# Patient Record
Sex: Male | Born: 1935 | Race: White | Hispanic: No | Marital: Married | Smoking: Former smoker
Health system: Southern US, Community
[De-identification: ages and names within clinical notes are randomized; demographics above are authoritative.]

## PROBLEM LIST (undated history)

## (undated) DIAGNOSIS — I10 Essential (primary) hypertension: Secondary | ICD-10-CM

## (undated) DIAGNOSIS — H409 Unspecified glaucoma: Secondary | ICD-10-CM

## (undated) DIAGNOSIS — R011 Cardiac murmur, unspecified: Secondary | ICD-10-CM

## (undated) DIAGNOSIS — M549 Dorsalgia, unspecified: Secondary | ICD-10-CM

## (undated) DIAGNOSIS — M109 Gout, unspecified: Secondary | ICD-10-CM

## (undated) DIAGNOSIS — R9431 Abnormal electrocardiogram [ECG] [EKG]: Secondary | ICD-10-CM

## (undated) DIAGNOSIS — H65499 Other chronic nonsuppurative otitis media, unspecified ear: Secondary | ICD-10-CM

## (undated) DIAGNOSIS — N401 Enlarged prostate with lower urinary tract symptoms: Secondary | ICD-10-CM

## (undated) DIAGNOSIS — E782 Mixed hyperlipidemia: Secondary | ICD-10-CM

## (undated) DIAGNOSIS — M159 Polyosteoarthritis, unspecified: Secondary | ICD-10-CM

## (undated) DIAGNOSIS — N138 Other obstructive and reflux uropathy: Secondary | ICD-10-CM

## (undated) DIAGNOSIS — I6529 Occlusion and stenosis of unspecified carotid artery: Secondary | ICD-10-CM

## (undated) DIAGNOSIS — R5383 Other fatigue: Secondary | ICD-10-CM

## (undated) DIAGNOSIS — R5381 Other malaise: Secondary | ICD-10-CM

## (undated) HISTORY — DX: Cardiac murmur, unspecified: R01.1

## (undated) HISTORY — DX: Abnormal electrocardiogram (ECG) (EKG): R94.31

## (undated) HISTORY — DX: Occlusion and stenosis of unspecified carotid artery: I65.29

## (undated) HISTORY — DX: Other malaise: R53.81

## (undated) HISTORY — PX: COLONOSCOPY: SHX174

## (undated) HISTORY — DX: Gout, unspecified: M10.9

## (undated) HISTORY — DX: Other chronic nonsuppurative otitis media, unspecified ear: H65.499

## (undated) HISTORY — DX: Other fatigue: R53.83

## (undated) HISTORY — DX: Polyosteoarthritis, unspecified: M15.9

## (undated) HISTORY — DX: Essential (primary) hypertension: I10

## (undated) HISTORY — DX: Dorsalgia, unspecified: M54.9

## (undated) HISTORY — DX: Other obstructive and reflux uropathy: N13.8

## (undated) HISTORY — DX: Unspecified glaucoma: H40.9

## (undated) HISTORY — DX: Benign prostatic hyperplasia with lower urinary tract symptoms: N40.1

## (undated) HISTORY — DX: Mixed hyperlipidemia: E78.2

---

## 1997-03-31 HISTORY — PX: TRANSURETHRAL RESECTION OF PROSTATE: SHX73

## 2003-03-29 ENCOUNTER — Ambulatory Visit: Admission: RE | Admit: 2003-03-29 | Discharge: 2003-03-29 | Payer: Self-pay | Admitting: Internal Medicine

## 2003-11-14 ENCOUNTER — Ambulatory Visit (HOSPITAL_COMMUNITY): Admission: RE | Admit: 2003-11-14 | Discharge: 2003-11-14 | Payer: Self-pay | Admitting: *Deleted

## 2003-11-14 ENCOUNTER — Encounter (INDEPENDENT_AMBULATORY_CARE_PROVIDER_SITE_OTHER): Payer: Self-pay | Admitting: *Deleted

## 2005-12-29 ENCOUNTER — Ambulatory Visit (HOSPITAL_COMMUNITY): Admission: RE | Admit: 2005-12-29 | Discharge: 2005-12-29 | Payer: Self-pay | Admitting: *Deleted

## 2007-10-20 ENCOUNTER — Encounter: Admission: RE | Admit: 2007-10-20 | Discharge: 2007-10-20 | Payer: Self-pay | Admitting: Internal Medicine

## 2008-07-14 ENCOUNTER — Encounter: Admission: RE | Admit: 2008-07-14 | Discharge: 2008-07-14 | Payer: Self-pay | Admitting: Internal Medicine

## 2010-04-22 ENCOUNTER — Encounter: Payer: Self-pay | Admitting: Internal Medicine

## 2010-08-16 NOTE — Op Note (Signed)
NAME:  CEVIN, RUBINSTEIN                   ACCOUNT NO.:  [id number]   MEDICAL RECORD NO.:  [id number]                   PATIENT TYPE:  AMB   LOCATION:  ENDO                                 FACILITY:  [facility]   PHYSICIAN:  Georgiana Spinner, M.D.                 DATE OF BIRTH:  1918-02-22   DATE OF PROCEDURE:  11/14/2003  DATE OF DISCHARGE:                                 OPERATIVE REPORT   PROCEDURE:  Colonoscopy with biopsy and polypectomy.   INDICATION:  Colon polyp.   ANESTHESIA:  1. Demerol 60.  2. Versed 6 mg.   PROCEDURE:  With the patient placed supine on the operating table, in the  left lateral decubitus position the Olympus videoscopic colonoscope was  inserted in the rectum, advanced from the rectum to the cecum identified by  the ileocecal valve and appendiceal orifice.  In the cecum was an AVM that  was fairly large and we photographed only.  There was also a polyp seen in  the cecum which was photographed and removed using snare cautery technique  __________ 20/20 blended current.  A second polyp was seen and it too was  then photographed and removed using snare cautery technique __________ 20/20  blended current.  Of note, hot biopsy forceps were needed for both of these  polyps at their base, because of a slight amount of bleeding, to cauterize  the base.  The endoscope was then slowly withdrawn, taking circumferential  views of remaining colonic mucosa, stopping near the hepatic flexure where a  third polyp was seen and was removed using hot biopsy forceps technique  only.  The endoscope was straightened and withdrawn.  The patient was  __________ .  The patient tolerated the procedure well with no apparent  complications.   FINDINGS:  __________ as described above, AVM of cecum, internal  hemorrhoids.   PLAN:  Await biopsy report.  Patient to call me for results of biopsy as an  outpatient.  See discharge dictation for further discharge notes and further  orders.                                               Georgiana Spinner, M.D.    GMO/MEDQ  D:  11/14/2003  T:  11/14/2003  Job:  [id number]

## 2010-08-16 NOTE — Op Note (Signed)
[name], Jason Austin              ACCOUNT NO.:  [id number]   MEDICAL RECORD NO.:  [id number]          PATIENT TYPE:  AMB   LOCATION:  ENDO                         FACILITY:  [facility]   PHYSICIAN:  Georgiana Spinner, M.D.    DATE OF BIRTH:  March 02, 1918   DATE OF PROCEDURE:  12/29/2005  DATE OF DISCHARGE:                                 OPERATIVE REPORT   PROCEDURE:  Colonoscopy.   INDICATIONS:  Colon cancer screening, colon polyps.   ANESTHESIA:  1. Fentanyl 75 mcg.  2. Versed 7.5 mg.   PROCEDURE:  With the patient mildly sedated in the left lateral decubitus  position, a rectal exam was performed which was unremarkable.  Subsequently,  the Olympus videoscopic colonoscope was inserted in the rectum, passed under  direct vision to the cecum, identified by the ileocecal valve and  appendiceal orifice, both of which were photographed.  From this point, the  colonoscope was slowly withdrawn, taking circumferential views of the  colonic mucosa, stopping in the rectum which appeared normal on direct and  showed hemorrhoids on retroflexed view. The endoscope was straightened and  withdrawn.  The patient's vital signs and pulse oximeter remained stable.  The patient tolerated the procedure well, without apparent complications.   FINDINGS:  Diverticulosis of sigmoid colon.  Internal hemorrhoids.  Otherwise, unremarkable examination.   PLAN:  Have patient follow up with me as needed.           ______________________________  Georgiana Spinner, M.D.     GMO/MEDQ  D:  12/29/2005  T:  12/29/2005  Job:  [id number]

## 2010-12-20 IMAGING — US US EXTREM LOW VENOUS*L*
1 series · 14 of 24 positions shown · non-contrast
Comparison: None

CLINICAL DATA: Left leg edema.



[Series 1: us extrem low venous*left* · 14 of 30 slices shown]
[im 1/30]
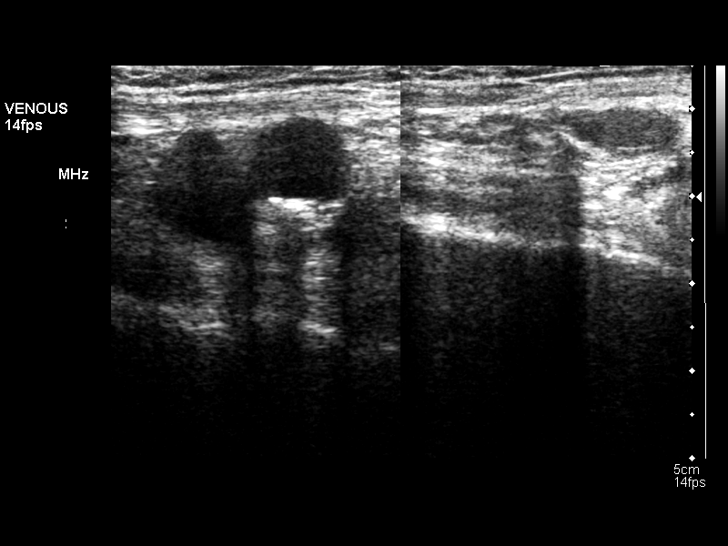
[im 3/30]
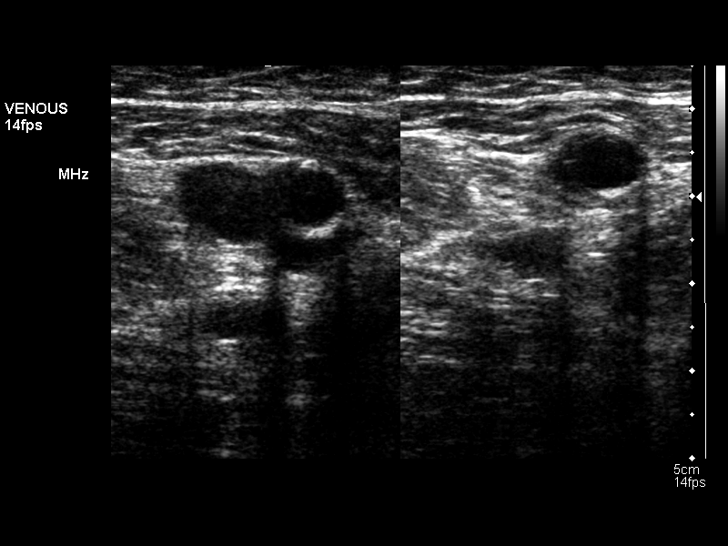
[im 6/30]
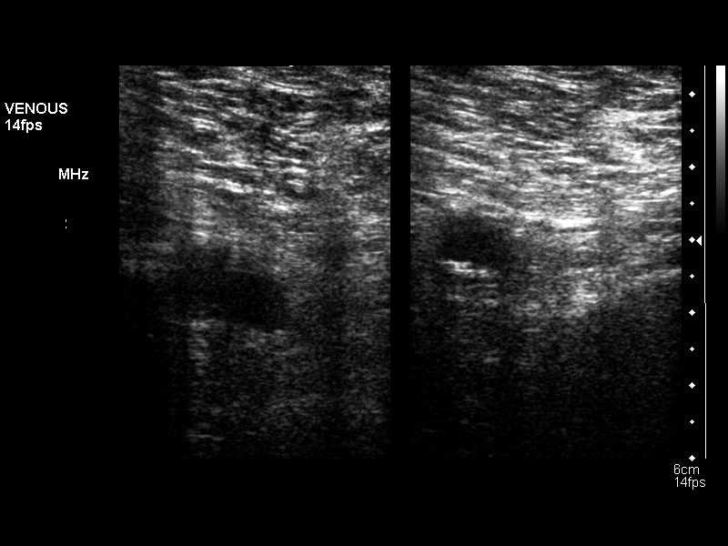
[im 8/30]
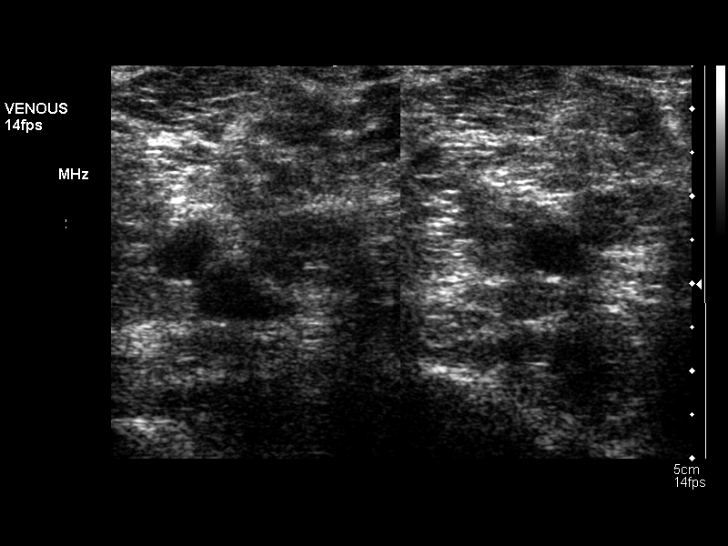
[im 9/30]
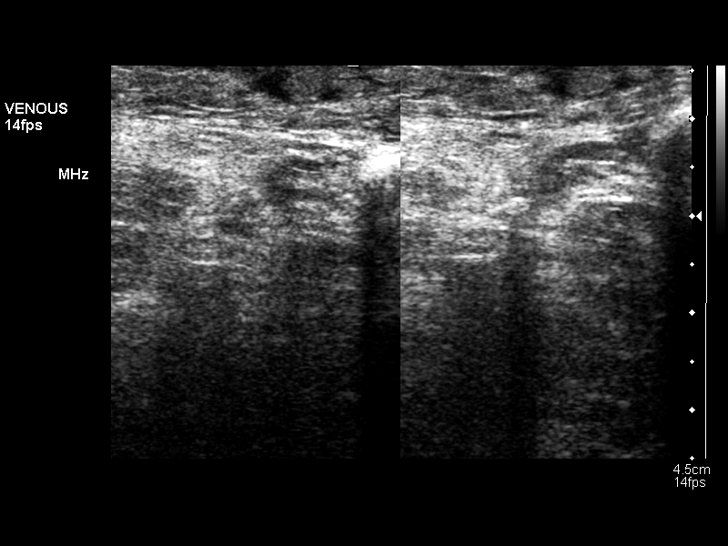
[im 12/30]
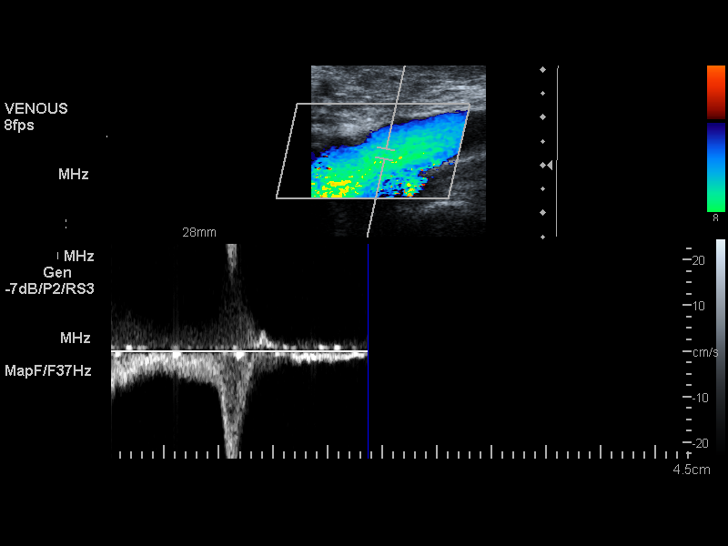
[im 14/30]
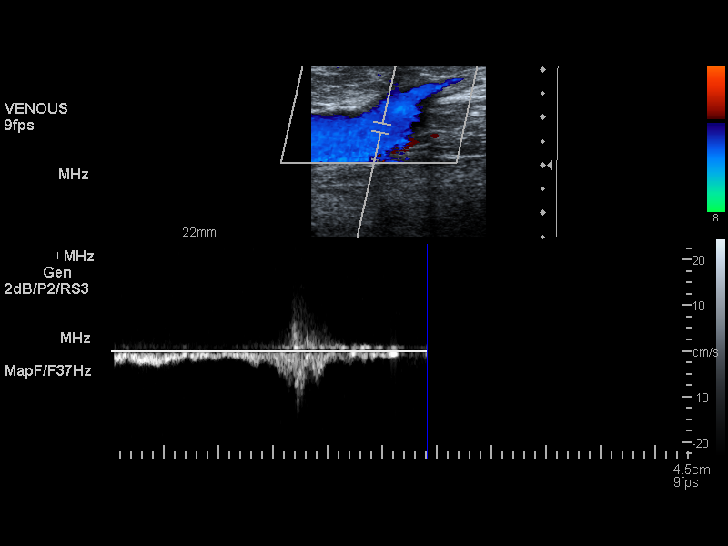
[im 16/30]
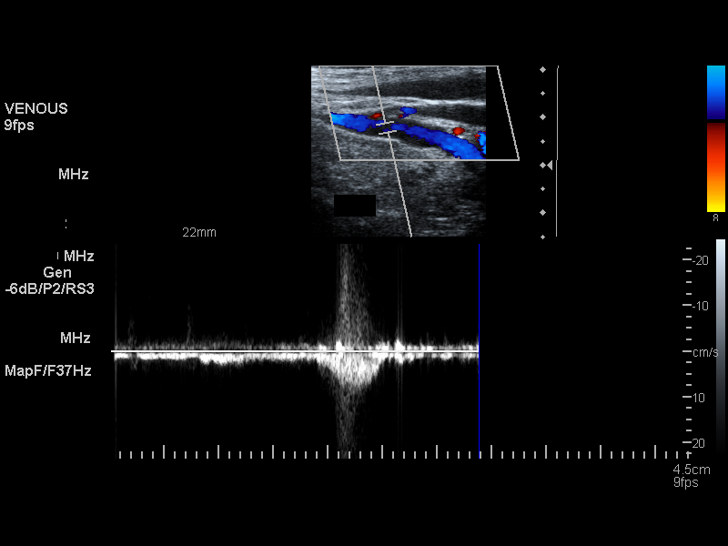
[im 18/30]
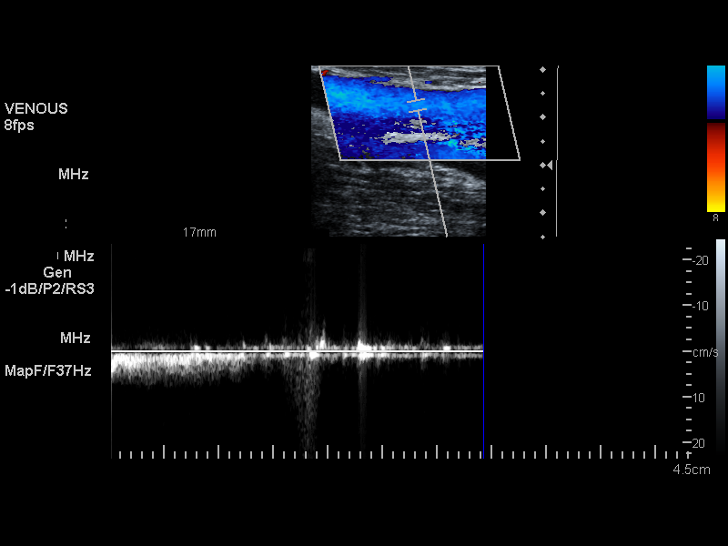
[im 21/30]
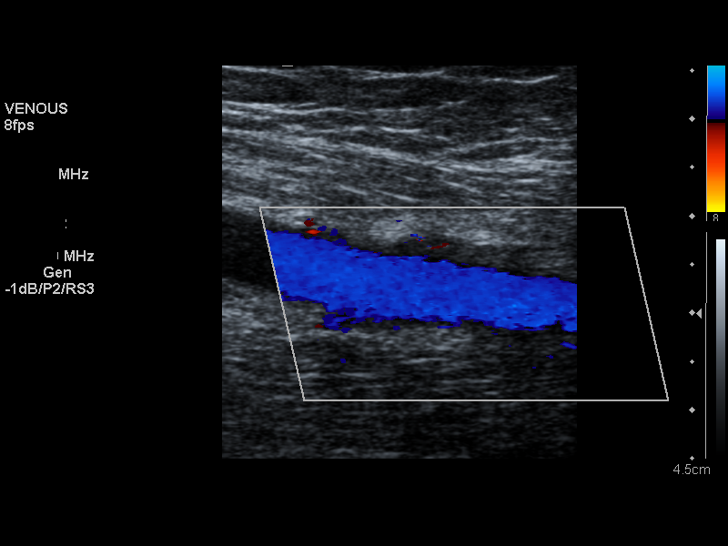
[im 23/30]
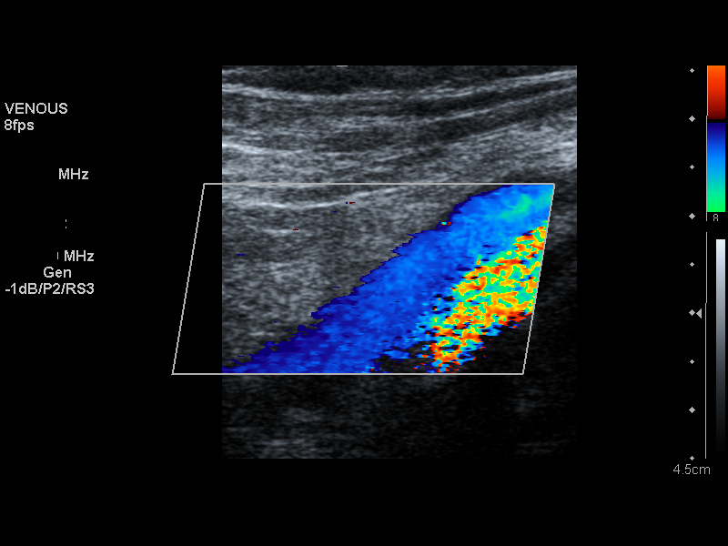
[im 24/30]
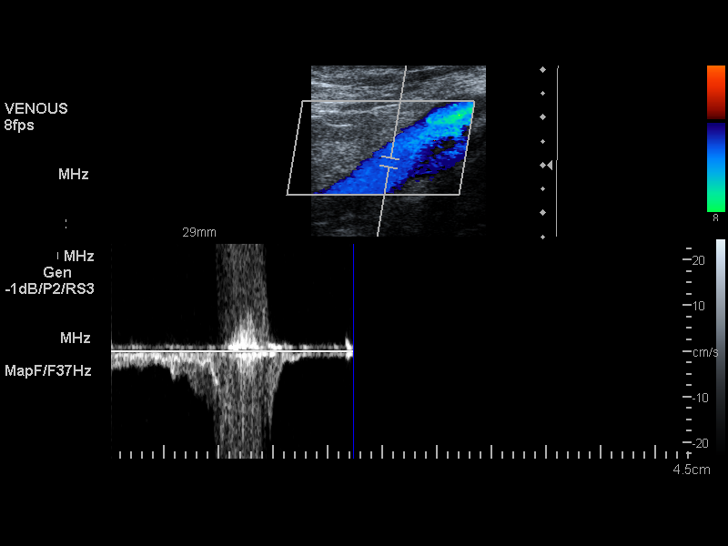
[im 27/30]
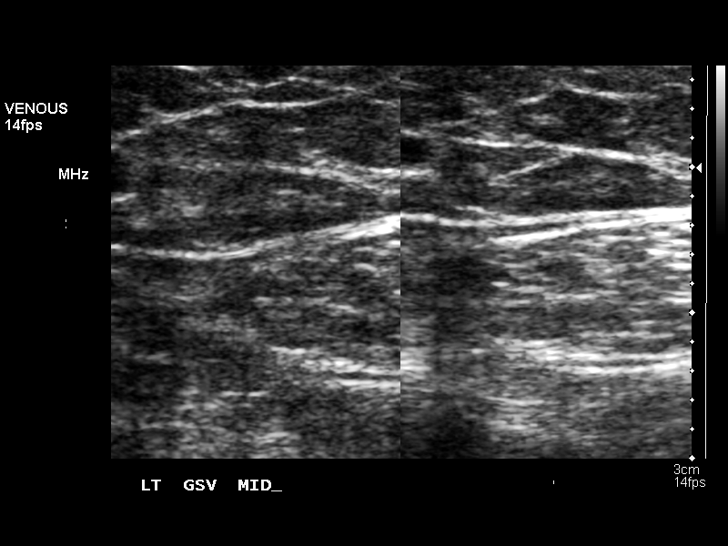
[im 30/30]
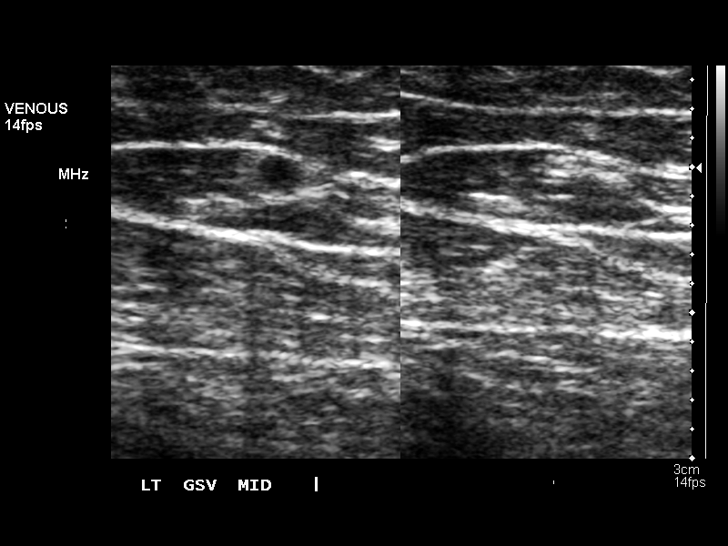

[14 of 24 positions shown; findings below may reference images not displayed]

FINDINGS: Normal compressibility of  the left common femoral,
superficial femoral, and popliteal veins is demonstrated, as well
as the visualized proximal calf veins.  No filling defects to
suggest DVT on grayscale or color Doppler imaging.  Doppler
waveforms show normal direction of venous flow, normal respiratory
phasicity and response to augmentation.
IMPRESSION: No evidence of  left lower extremity deep vein thrombosis.

## 2011-04-16 DIAGNOSIS — H409 Unspecified glaucoma: Secondary | ICD-10-CM | POA: Diagnosis not present

## 2011-04-16 DIAGNOSIS — H3530 Unspecified macular degeneration: Secondary | ICD-10-CM | POA: Diagnosis not present

## 2011-04-16 DIAGNOSIS — H4011X0 Primary open-angle glaucoma, stage unspecified: Secondary | ICD-10-CM | POA: Diagnosis not present

## 2011-06-30 DIAGNOSIS — H66009 Acute suppurative otitis media without spontaneous rupture of ear drum, unspecified ear: Secondary | ICD-10-CM | POA: Diagnosis not present

## 2011-06-30 DIAGNOSIS — H7290 Unspecified perforation of tympanic membrane, unspecified ear: Secondary | ICD-10-CM | POA: Diagnosis not present

## 2011-07-28 DIAGNOSIS — H7290 Unspecified perforation of tympanic membrane, unspecified ear: Secondary | ICD-10-CM | POA: Diagnosis not present

## 2011-07-28 DIAGNOSIS — H66009 Acute suppurative otitis media without spontaneous rupture of ear drum, unspecified ear: Secondary | ICD-10-CM | POA: Diagnosis not present

## 2011-08-14 DIAGNOSIS — H4011X0 Primary open-angle glaucoma, stage unspecified: Secondary | ICD-10-CM | POA: Diagnosis not present

## 2011-08-14 DIAGNOSIS — H409 Unspecified glaucoma: Secondary | ICD-10-CM | POA: Diagnosis not present

## 2011-08-14 DIAGNOSIS — H3530 Unspecified macular degeneration: Secondary | ICD-10-CM | POA: Diagnosis not present

## 2011-09-02 DIAGNOSIS — I6529 Occlusion and stenosis of unspecified carotid artery: Secondary | ICD-10-CM | POA: Diagnosis not present

## 2011-09-02 DIAGNOSIS — E782 Mixed hyperlipidemia: Secondary | ICD-10-CM | POA: Diagnosis not present

## 2011-09-02 DIAGNOSIS — R9431 Abnormal electrocardiogram [ECG] [EKG]: Secondary | ICD-10-CM | POA: Diagnosis not present

## 2011-09-02 DIAGNOSIS — M109 Gout, unspecified: Secondary | ICD-10-CM | POA: Diagnosis not present

## 2011-09-02 DIAGNOSIS — I10 Essential (primary) hypertension: Secondary | ICD-10-CM | POA: Diagnosis not present

## 2011-09-02 DIAGNOSIS — R5381 Other malaise: Secondary | ICD-10-CM | POA: Diagnosis not present

## 2011-09-10 DIAGNOSIS — E782 Mixed hyperlipidemia: Secondary | ICD-10-CM | POA: Diagnosis not present

## 2011-09-10 DIAGNOSIS — M109 Gout, unspecified: Secondary | ICD-10-CM | POA: Diagnosis not present

## 2011-09-10 DIAGNOSIS — I10 Essential (primary) hypertension: Secondary | ICD-10-CM | POA: Diagnosis not present

## 2011-09-10 DIAGNOSIS — I6529 Occlusion and stenosis of unspecified carotid artery: Secondary | ICD-10-CM | POA: Diagnosis not present

## 2011-09-22 DIAGNOSIS — H66009 Acute suppurative otitis media without spontaneous rupture of ear drum, unspecified ear: Secondary | ICD-10-CM | POA: Diagnosis not present

## 2011-10-08 DIAGNOSIS — H7290 Unspecified perforation of tympanic membrane, unspecified ear: Secondary | ICD-10-CM | POA: Diagnosis not present

## 2011-10-08 DIAGNOSIS — H9190 Unspecified hearing loss, unspecified ear: Secondary | ICD-10-CM | POA: Diagnosis not present

## 2011-10-08 DIAGNOSIS — H66009 Acute suppurative otitis media without spontaneous rupture of ear drum, unspecified ear: Secondary | ICD-10-CM | POA: Diagnosis not present

## 2011-10-21 DIAGNOSIS — N401 Enlarged prostate with lower urinary tract symptoms: Secondary | ICD-10-CM | POA: Diagnosis not present

## 2011-10-21 DIAGNOSIS — M159 Polyosteoarthritis, unspecified: Secondary | ICD-10-CM | POA: Diagnosis not present

## 2011-10-21 DIAGNOSIS — E782 Mixed hyperlipidemia: Secondary | ICD-10-CM | POA: Diagnosis not present

## 2011-10-21 DIAGNOSIS — I10 Essential (primary) hypertension: Secondary | ICD-10-CM | POA: Diagnosis not present

## 2011-12-11 DIAGNOSIS — Z961 Presence of intraocular lens: Secondary | ICD-10-CM | POA: Diagnosis not present

## 2011-12-11 DIAGNOSIS — H4011X0 Primary open-angle glaucoma, stage unspecified: Secondary | ICD-10-CM | POA: Diagnosis not present

## 2011-12-11 DIAGNOSIS — H3530 Unspecified macular degeneration: Secondary | ICD-10-CM | POA: Diagnosis not present

## 2012-02-17 DIAGNOSIS — M109 Gout, unspecified: Secondary | ICD-10-CM | POA: Diagnosis not present

## 2012-02-17 DIAGNOSIS — Z0000 Encounter for general adult medical examination without abnormal findings: Secondary | ICD-10-CM | POA: Diagnosis not present

## 2012-02-17 DIAGNOSIS — Z1331 Encounter for screening for depression: Secondary | ICD-10-CM | POA: Diagnosis not present

## 2012-02-17 DIAGNOSIS — E782 Mixed hyperlipidemia: Secondary | ICD-10-CM | POA: Diagnosis not present

## 2012-02-17 DIAGNOSIS — R9431 Abnormal electrocardiogram [ECG] [EKG]: Secondary | ICD-10-CM | POA: Diagnosis not present

## 2012-02-17 DIAGNOSIS — I10 Essential (primary) hypertension: Secondary | ICD-10-CM | POA: Diagnosis not present

## 2012-03-03 DIAGNOSIS — M109 Gout, unspecified: Secondary | ICD-10-CM | POA: Diagnosis not present

## 2012-03-03 DIAGNOSIS — I10 Essential (primary) hypertension: Secondary | ICD-10-CM | POA: Diagnosis not present

## 2012-03-03 DIAGNOSIS — R9431 Abnormal electrocardiogram [ECG] [EKG]: Secondary | ICD-10-CM | POA: Diagnosis not present

## 2012-03-03 DIAGNOSIS — E782 Mixed hyperlipidemia: Secondary | ICD-10-CM | POA: Diagnosis not present

## 2012-03-18 DIAGNOSIS — I6529 Occlusion and stenosis of unspecified carotid artery: Secondary | ICD-10-CM | POA: Diagnosis not present

## 2012-06-10 DIAGNOSIS — H4011X0 Primary open-angle glaucoma, stage unspecified: Secondary | ICD-10-CM | POA: Diagnosis not present

## 2012-06-10 DIAGNOSIS — H3530 Unspecified macular degeneration: Secondary | ICD-10-CM | POA: Diagnosis not present

## 2012-06-10 DIAGNOSIS — Z961 Presence of intraocular lens: Secondary | ICD-10-CM | POA: Diagnosis not present

## 2012-06-10 DIAGNOSIS — I6529 Occlusion and stenosis of unspecified carotid artery: Secondary | ICD-10-CM | POA: Diagnosis not present

## 2012-07-01 DIAGNOSIS — R42 Dizziness and giddiness: Secondary | ICD-10-CM | POA: Diagnosis not present

## 2012-07-01 DIAGNOSIS — I10 Essential (primary) hypertension: Secondary | ICD-10-CM | POA: Diagnosis not present

## 2012-07-01 DIAGNOSIS — I959 Hypotension, unspecified: Secondary | ICD-10-CM | POA: Diagnosis not present

## 2012-07-05 DIAGNOSIS — I10 Essential (primary) hypertension: Secondary | ICD-10-CM | POA: Diagnosis not present

## 2012-07-05 DIAGNOSIS — I499 Cardiac arrhythmia, unspecified: Secondary | ICD-10-CM | POA: Diagnosis not present

## 2012-07-05 DIAGNOSIS — R42 Dizziness and giddiness: Secondary | ICD-10-CM | POA: Diagnosis not present

## 2012-07-07 DIAGNOSIS — I499 Cardiac arrhythmia, unspecified: Secondary | ICD-10-CM | POA: Diagnosis not present

## 2012-07-07 DIAGNOSIS — I10 Essential (primary) hypertension: Secondary | ICD-10-CM | POA: Diagnosis not present

## 2012-07-07 DIAGNOSIS — I509 Heart failure, unspecified: Secondary | ICD-10-CM | POA: Diagnosis not present

## 2012-07-07 DIAGNOSIS — R42 Dizziness and giddiness: Secondary | ICD-10-CM | POA: Diagnosis not present

## 2012-07-08 DIAGNOSIS — R002 Palpitations: Secondary | ICD-10-CM | POA: Diagnosis not present

## 2012-07-08 DIAGNOSIS — I10 Essential (primary) hypertension: Secondary | ICD-10-CM | POA: Diagnosis not present

## 2012-07-08 DIAGNOSIS — R011 Cardiac murmur, unspecified: Secondary | ICD-10-CM | POA: Diagnosis not present

## 2012-07-08 DIAGNOSIS — N401 Enlarged prostate with lower urinary tract symptoms: Secondary | ICD-10-CM | POA: Diagnosis not present

## 2012-07-22 DIAGNOSIS — R42 Dizziness and giddiness: Secondary | ICD-10-CM | POA: Diagnosis not present

## 2012-07-22 DIAGNOSIS — I10 Essential (primary) hypertension: Secondary | ICD-10-CM | POA: Diagnosis not present

## 2012-07-22 DIAGNOSIS — I6529 Occlusion and stenosis of unspecified carotid artery: Secondary | ICD-10-CM | POA: Diagnosis not present

## 2012-07-22 DIAGNOSIS — E785 Hyperlipidemia, unspecified: Secondary | ICD-10-CM | POA: Diagnosis not present

## 2012-08-04 DIAGNOSIS — E782 Mixed hyperlipidemia: Secondary | ICD-10-CM | POA: Diagnosis not present

## 2012-08-04 DIAGNOSIS — M109 Gout, unspecified: Secondary | ICD-10-CM | POA: Diagnosis not present

## 2012-08-04 DIAGNOSIS — R9431 Abnormal electrocardiogram [ECG] [EKG]: Secondary | ICD-10-CM | POA: Diagnosis not present

## 2012-08-04 DIAGNOSIS — I10 Essential (primary) hypertension: Secondary | ICD-10-CM | POA: Diagnosis not present

## 2012-08-04 DIAGNOSIS — R5381 Other malaise: Secondary | ICD-10-CM | POA: Diagnosis not present

## 2012-08-10 DIAGNOSIS — E782 Mixed hyperlipidemia: Secondary | ICD-10-CM | POA: Diagnosis not present

## 2012-08-10 DIAGNOSIS — I6529 Occlusion and stenosis of unspecified carotid artery: Secondary | ICD-10-CM | POA: Diagnosis not present

## 2012-08-10 DIAGNOSIS — M109 Gout, unspecified: Secondary | ICD-10-CM | POA: Diagnosis not present

## 2012-08-10 DIAGNOSIS — I10 Essential (primary) hypertension: Secondary | ICD-10-CM | POA: Diagnosis not present

## 2012-08-12 DIAGNOSIS — R42 Dizziness and giddiness: Secondary | ICD-10-CM | POA: Diagnosis not present

## 2012-08-12 DIAGNOSIS — I10 Essential (primary) hypertension: Secondary | ICD-10-CM | POA: Diagnosis not present

## 2012-08-12 DIAGNOSIS — I359 Nonrheumatic aortic valve disorder, unspecified: Secondary | ICD-10-CM | POA: Diagnosis not present

## 2012-09-23 DIAGNOSIS — R0602 Shortness of breath: Secondary | ICD-10-CM | POA: Diagnosis not present

## 2012-09-23 DIAGNOSIS — I10 Essential (primary) hypertension: Secondary | ICD-10-CM | POA: Diagnosis not present

## 2012-09-23 DIAGNOSIS — I6529 Occlusion and stenosis of unspecified carotid artery: Secondary | ICD-10-CM | POA: Diagnosis not present

## 2012-11-11 DIAGNOSIS — I10 Essential (primary) hypertension: Secondary | ICD-10-CM | POA: Diagnosis not present

## 2012-11-11 DIAGNOSIS — R0602 Shortness of breath: Secondary | ICD-10-CM | POA: Diagnosis not present

## 2012-11-11 DIAGNOSIS — I6529 Occlusion and stenosis of unspecified carotid artery: Secondary | ICD-10-CM | POA: Diagnosis not present

## 2012-11-11 DIAGNOSIS — R42 Dizziness and giddiness: Secondary | ICD-10-CM | POA: Diagnosis not present

## 2012-12-09 DIAGNOSIS — H04129 Dry eye syndrome of unspecified lacrimal gland: Secondary | ICD-10-CM | POA: Diagnosis not present

## 2012-12-09 DIAGNOSIS — H353190 Nonexudative age-related macular degeneration, unspecified eye, stage unspecified: Secondary | ICD-10-CM | POA: Diagnosis not present

## 2012-12-09 DIAGNOSIS — Z961 Presence of intraocular lens: Secondary | ICD-10-CM | POA: Diagnosis not present

## 2012-12-09 DIAGNOSIS — H4011X0 Primary open-angle glaucoma, stage unspecified: Secondary | ICD-10-CM | POA: Diagnosis not present

## 2012-12-30 DIAGNOSIS — Z23 Encounter for immunization: Secondary | ICD-10-CM | POA: Diagnosis not present

## 2013-02-23 DIAGNOSIS — I10 Essential (primary) hypertension: Secondary | ICD-10-CM | POA: Diagnosis not present

## 2013-02-23 DIAGNOSIS — M109 Gout, unspecified: Secondary | ICD-10-CM | POA: Diagnosis not present

## 2013-02-23 DIAGNOSIS — E782 Mixed hyperlipidemia: Secondary | ICD-10-CM | POA: Diagnosis not present

## 2013-02-23 DIAGNOSIS — I6529 Occlusion and stenosis of unspecified carotid artery: Secondary | ICD-10-CM | POA: Diagnosis not present

## 2013-02-23 DIAGNOSIS — M159 Polyosteoarthritis, unspecified: Secondary | ICD-10-CM | POA: Diagnosis not present

## 2013-03-14 DIAGNOSIS — Z23 Encounter for immunization: Secondary | ICD-10-CM | POA: Diagnosis not present

## 2013-04-05 DIAGNOSIS — L570 Actinic keratosis: Secondary | ICD-10-CM | POA: Diagnosis not present

## 2013-04-05 DIAGNOSIS — Z85828 Personal history of other malignant neoplasm of skin: Secondary | ICD-10-CM | POA: Diagnosis not present

## 2013-04-15 DIAGNOSIS — I10 Essential (primary) hypertension: Secondary | ICD-10-CM | POA: Diagnosis not present

## 2013-04-15 DIAGNOSIS — N401 Enlarged prostate with lower urinary tract symptoms: Secondary | ICD-10-CM | POA: Diagnosis not present

## 2013-04-15 DIAGNOSIS — M549 Dorsalgia, unspecified: Secondary | ICD-10-CM | POA: Diagnosis not present

## 2013-04-15 DIAGNOSIS — E782 Mixed hyperlipidemia: Secondary | ICD-10-CM | POA: Diagnosis not present

## 2013-07-05 DIAGNOSIS — Z85828 Personal history of other malignant neoplasm of skin: Secondary | ICD-10-CM | POA: Diagnosis not present

## 2013-07-05 DIAGNOSIS — L570 Actinic keratosis: Secondary | ICD-10-CM | POA: Diagnosis not present

## 2013-10-12 DIAGNOSIS — R269 Unspecified abnormalities of gait and mobility: Secondary | ICD-10-CM | POA: Diagnosis not present

## 2013-10-12 DIAGNOSIS — N138 Other obstructive and reflux uropathy: Secondary | ICD-10-CM | POA: Diagnosis not present

## 2013-10-12 DIAGNOSIS — I10 Essential (primary) hypertension: Secondary | ICD-10-CM | POA: Diagnosis not present

## 2013-10-12 DIAGNOSIS — N401 Enlarged prostate with lower urinary tract symptoms: Secondary | ICD-10-CM | POA: Diagnosis not present

## 2013-10-12 DIAGNOSIS — E782 Mixed hyperlipidemia: Secondary | ICD-10-CM | POA: Diagnosis not present

## 2013-10-17 ENCOUNTER — Ambulatory Visit: Payer: Medicare Other | Admitting: Neurology

## 2013-10-20 ENCOUNTER — Encounter: Payer: Self-pay | Admitting: *Deleted

## 2013-10-20 ENCOUNTER — Ambulatory Visit (INDEPENDENT_AMBULATORY_CARE_PROVIDER_SITE_OTHER): Payer: Medicare Other | Admitting: Neurology

## 2013-10-20 ENCOUNTER — Other Ambulatory Visit: Payer: Self-pay | Admitting: *Deleted

## 2013-10-20 VITALS — BP 167/73 | HR 53 | Temp 96.8°F | Ht 68.0 in | Wt 151.0 lb

## 2013-10-20 DIAGNOSIS — G609 Hereditary and idiopathic neuropathy, unspecified: Secondary | ICD-10-CM

## 2013-10-20 DIAGNOSIS — R600 Localized edema: Secondary | ICD-10-CM

## 2013-10-20 DIAGNOSIS — R2689 Other abnormalities of gait and mobility: Secondary | ICD-10-CM

## 2013-10-20 DIAGNOSIS — R609 Edema, unspecified: Secondary | ICD-10-CM

## 2013-10-20 DIAGNOSIS — R269 Unspecified abnormalities of gait and mobility: Secondary | ICD-10-CM | POA: Diagnosis not present

## 2013-10-20 DIAGNOSIS — R42 Dizziness and giddiness: Secondary | ICD-10-CM

## 2013-10-20 NOTE — Progress Notes (Signed)
Subjective:    Patient ID: Jason Austin is a 80 y.o. male.  HPI    Star Age, MD, PhD [facility] [address] [place], [place] [address]  Dear Dr. Ashby Dawes,  I saw your patient, Jason Austin, upon your kind request in my neurologic clinic today for initial consultation of his dizziness. The patient is accompanied by his daughter-in-law today. As you know, Jason Austin is a 80 year old right-handed gentleman with an underlying medical history of hypertension, hyperlipidemia, carotid artery disease, gout, osteoarthritis, glaucoma, BPH, who reports recurrent episodes of dizziness during which time he feels a sensation of unsteadiness but denies lightheadedness or a faint feeling and denies true vertigo as in a sense of spinning sensation. The symptoms are off and on and seemed worse when he goes out in the heat to walk. They do not seem to change with position, unless, he changes position too quickly. He feels, he cannot walk in a straight line. He has not fallen. He does not use a cane or a walker. He walks every evening about 20 minutes. He lives with his wife at [facility] independent [facility].  He had brain MRI without contrast on 10/20/07, which I reviewed: Abnormal heterogeneous signal in the right mastoid air cells  extending into the middle ear cavity with associated restricted diffusion. This may represent retained secretions secondary to inflammatory mastoiditis with possibility of an underlying cholesteatoma given the restricted diffusion.  2. Further evaluation with CT of the temporal bones is recommended. 3. Nonspecific subcortical white matter changes supratentorially. These may relate to small vessel disease changes due to hypertension and/or diabetes with less likely possibility of a demyelinating process versus vasculitis. 4. Occasional lacunar infarcts supratentorially.  5. Mild to moderate mucosal thickening in the  ethmoids, the frontal sinuses and the maxillary sinuses.  6.The above results were relayed to Dr Renette Butters nurse at approx. 9. 30 AM   His Past Medical History Is Significant For: Past Medical History  Diagnosis Date  . Essential hypertension, benign   . Mixed hyperlipidemia   . Occlusion and stenosis of carotid artery without mention of cerebral infarction   . Gouty arthropathy, unspecified   . Nonspecific abnormal electrocardiogram (ECG) (EKG)   . Other malaise and fatigue   . Generalized osteoarthrosis, unspecified site   . Other and unspecified chronic nonsuppurative otitis media   . Hypertrophy of prostate with urinary obstruction and other lower urinary tract symptoms (LUTS)   . Undiagnosed cardiac murmurs   . Unspecified glaucoma   . Backache, unspecified     His Past Surgical History Is Significant For: Past Surgical History  Procedure Laterality Date  . Transurethral resection of prostate  1999  . Colonoscopy      His Family History Is Significant For: Family History  Problem Relation Age of Onset  . Heart failure Father   . Cancer Mother     His Social History Is Significant For: History   Social History  . Marital Status: Married    Spouse Name: N/A    Number of Children: N/A  . Years of Education: N/A   Social History Main Topics  . Smoking status: Former Smoker    Quit date: 04/22/1930  . Smokeless tobacco: None  . Alcohol Use: Yes  . Drug Use: No  . Sexual Activity: None   Other Topics Concern  . None   Social History Narrative  . None    His Allergies Are:  No Known  Allergies:   His Current Medications Are:  Outpatient Encounter Prescriptions as of 10/20/2013  Medication Sig  . alfuzosin (UROXATRAL) 10 MG 24 hr tablet   . aspirin 81 MG tablet Take 81 mg by mouth daily.  Marland Kitchen atorvastatin (LIPITOR) 40 MG tablet   . Coenzyme Q10 (CO Q-10) 120 MG CAPS Take 1 tablet by mouth daily.  . hydrochlorothiazide (HYDRODIURIL) 25 MG tablet   .  irbesartan (AVAPRO) 300 MG tablet   . Multiple Vitamin (MULTI VITAMIN DAILY PO) Take 1 tablet by mouth daily.  . saw palmetto 160 MG capsule Take 160 mg by mouth 2 (two) times daily.  :   Review of Systems:  Out of a complete 14 point review of systems, all are reviewed and negative with the exception of these symptoms as listed below:  Review of Systems  Constitutional: Negative.   HENT: Positive for hearing loss.   Eyes: Negative.   Respiratory: Positive for shortness of breath.   Cardiovascular: Positive for leg swelling.  Gastrointestinal: Negative.   Endocrine: Negative.   Genitourinary: Negative.   Musculoskeletal: Negative.   Skin: Negative.   Allergic/Immunologic: Negative.   Neurological: Positive for dizziness.  Hematological: Negative.   Psychiatric/Behavioral: Negative.     Objective:  Neurologic Exam  Physical Exam Physical Examination:   Filed Vitals:   10/20/13 1413  BP: 167/73  Pulse: 53  Temp:    He had no orthostatic BP or pulse changes.   General Examination: The patient is a very pleasant 80 y.o. male in no acute distress. He is calm and cooperative with the exam. He is well groomed and situated in a chair.   HEENT: Normocephalic, atraumatic, pupils are equal, round and reactive to light and accommodation. Funduscopic exam is normal with sharp disc margins noted. Extraocular tracking shows mild saccadic breakdown without nystagmus noted. Hearing is impaired on the R. Face is symmetric with no facial masking and normal facial sensation. There is no lip, neck or jaw tremor. Neck is mildly rigid with intact passive ROM. There are no carotid bruits on the L and there is a mild bruit the R on auscultation. Oropharynx exam reveals mild mouth dryness. No significant airway crowding is noted. Mallampati is class I. Tongue protrudes centrally and palate elevates symmetrically.    Chest: is clear to auscultation without wheezing, rhonchi or crackles  noted.  Heart: sounds are regular and normal without murmurs, rubs or gallops noted.   Abdomen: is soft, non-tender and non-distended with normal bowel sounds appreciated on auscultation.  Extremities: There is 3+ pitting edema in the distal lower extremities bilaterally. Pedal pulses are intact.   Skin: is warm and dry with no trophic changes noted. Age-related changes are noted on the skin.   Musculoskeletal: exam reveals no obvious joint deformities, tenderness or joint swelling or erythema.   Neurologically:  Mental status: The patient is awake and alert, paying good  attention. He is able to completely provide the history. His daughter provides details. He is oriented to: person, place, time/date, situation, day of week, month of year and year. His memory, attention, language and knowledge are fairly well intact. There is no aphasia, agnosia, apraxia or anomia. There is a no bradyphrenia. Speech is mildly hypophonic with no dysarthria noted. Mood is congruent and affect is normal.   Cranial nerves are as described above under HEENT exam. In addition, shoulder shrug is normal with equal shoulder height noted.   Motor exam: Normal bulk, and strength for age is noted.  Tone is not rigid with absence of cogwheeling in the extremities. There is no tremor.   Romberg is negative. Reflexes are 1+ in the upper extremities and trace in the lower extremities. Toes are downgoing bilaterally. Fine motor skills: Finger taps, hand movements, and rapid alternating patting are mildly impaired bilaterally. Foot taps and foot agility are mildly impaired bilaterally.   Cerebellar testing shows no dysmetria or intention tremor on finger to nose testing. Heel to shin is unremarkable. There is no truncal or gait ataxia.   Sensory exam is intact to light touch, pinprick, vibration, temperature sense in the upper and lower extremities with the exception of mild decrease in vibration sense in the distal lower  extremities but his sensory findings may be compounded by significant swelling.   Gait, station and balance: He stands up from the seated position with mild difficulty and needs to push himself up. No veering to one side is noted. No leaning to one side. Posture is mildly stooped and there is kyphosis. Stance is wide-based. He walks cautiously. He turns in 3 steps. Tandem walk is not possible. Balance is mildly impaired.   Assessment and Plan:   In summary, Osualdo Hansell is a very pleasant 80 y.o.-year old male with an underlying medical history of hypertension, hyperlipidemia, carotid artery disease, gout, osteoarthritis, glaucoma, BPH, who reports recurrent episodes of dizziness during which time he feels a sensation of unsteadiness but denies lightheadedness or a faint feeling and denies true vertigo as in a sense of spinning sensation. On examination, he has a mildly unsteady gait, no orthostatic findings or symptoms, no true vertigo. He has a nonspecific gait dysfunction, I think due to a combination of factors. I think he has a tendency to not drink enough water, lower extremity swelling may be a contributor to his symptoms, been on a diuretic makes him at risk for dehydration. Symptoms are worse when he is outside in the heat. He is advised to drink more water and also start using compression stockings if possible if you agree. He is advised to proceed with physical therapy evaluation. He would like to look into this locally at his independent living facility. Furthermore, he is advised to change positions slowly. He had a brain MRI in the past which I reviewed with them. He also has a right carotid bruit which does not seem to be a new finding as he says that he was seen by vascular surgery before. At this juncture, I think supportive treatment and measures will be helpful. I do not think we need to proceed with any additional testing at this time. He may have a mild degree of peripheral  neuropathy as a contributing factor as well. I talked to the patient and his daughter-in-law at length today and they were in agreement. I may be able to see him back on an as-needed basis at this time.   Thank you very much for allowing me to participate in the care of this nice patient. If I can be of any further assistance to you please do not hesitate to call me at [phone].  Sincerely,   Star Age, MD, PhD

## 2013-10-20 NOTE — Patient Instructions (Signed)
Please keep your feet more upright.  Drink more water.  Look into Physical therapy.  Use compression stockings and talk with Dr. Ashby Dawes about it.  I do not believe, we need to do more testing.

## 2013-12-22 DIAGNOSIS — Z961 Presence of intraocular lens: Secondary | ICD-10-CM | POA: Diagnosis not present

## 2013-12-22 DIAGNOSIS — H353190 Nonexudative age-related macular degeneration, unspecified eye, stage unspecified: Secondary | ICD-10-CM | POA: Diagnosis not present

## 2013-12-22 DIAGNOSIS — H4011X0 Primary open-angle glaucoma, stage unspecified: Secondary | ICD-10-CM | POA: Diagnosis not present

## 2013-12-28 DIAGNOSIS — Z23 Encounter for immunization: Secondary | ICD-10-CM | POA: Diagnosis not present

## 2014-01-18 DIAGNOSIS — E782 Mixed hyperlipidemia: Secondary | ICD-10-CM | POA: Diagnosis not present

## 2014-01-18 DIAGNOSIS — R7309 Other abnormal glucose: Secondary | ICD-10-CM | POA: Diagnosis not present

## 2014-01-30 DIAGNOSIS — Z08 Encounter for follow-up examination after completed treatment for malignant neoplasm: Secondary | ICD-10-CM | POA: Diagnosis not present

## 2014-01-30 DIAGNOSIS — Z85828 Personal history of other malignant neoplasm of skin: Secondary | ICD-10-CM | POA: Diagnosis not present

## 2014-01-30 DIAGNOSIS — E782 Mixed hyperlipidemia: Secondary | ICD-10-CM | POA: Diagnosis not present

## 2014-01-30 DIAGNOSIS — L570 Actinic keratosis: Secondary | ICD-10-CM | POA: Diagnosis not present

## 2014-01-30 DIAGNOSIS — I10 Essential (primary) hypertension: Secondary | ICD-10-CM | POA: Diagnosis not present

## 2014-03-09 DIAGNOSIS — H04123 Dry eye syndrome of bilateral lacrimal glands: Secondary | ICD-10-CM | POA: Diagnosis not present

## 2014-03-09 DIAGNOSIS — Z961 Presence of intraocular lens: Secondary | ICD-10-CM | POA: Diagnosis not present

## 2014-03-09 DIAGNOSIS — H1851 Endothelial corneal dystrophy: Secondary | ICD-10-CM | POA: Diagnosis not present

## 2014-03-09 DIAGNOSIS — H4011X3 Primary open-angle glaucoma, severe stage: Secondary | ICD-10-CM | POA: Diagnosis not present

## 2014-03-09 DIAGNOSIS — H4011X2 Primary open-angle glaucoma, moderate stage: Secondary | ICD-10-CM | POA: Diagnosis not present

## 2014-03-09 DIAGNOSIS — H3531 Nonexudative age-related macular degeneration: Secondary | ICD-10-CM | POA: Diagnosis not present

## 2014-03-09 DIAGNOSIS — H35371 Puckering of macula, right eye: Secondary | ICD-10-CM | POA: Diagnosis not present

## 2014-03-16 ENCOUNTER — Encounter (INDEPENDENT_AMBULATORY_CARE_PROVIDER_SITE_OTHER): Payer: Medicare Other | Admitting: Ophthalmology

## 2014-03-16 DIAGNOSIS — H33302 Unspecified retinal break, left eye: Secondary | ICD-10-CM | POA: Diagnosis not present

## 2014-03-16 DIAGNOSIS — H43813 Vitreous degeneration, bilateral: Secondary | ICD-10-CM | POA: Diagnosis not present

## 2014-03-16 DIAGNOSIS — H35371 Puckering of macula, right eye: Secondary | ICD-10-CM

## 2014-03-16 DIAGNOSIS — H35033 Hypertensive retinopathy, bilateral: Secondary | ICD-10-CM

## 2014-03-16 DIAGNOSIS — I10 Essential (primary) hypertension: Secondary | ICD-10-CM

## 2014-03-16 DIAGNOSIS — H3531 Nonexudative age-related macular degeneration: Secondary | ICD-10-CM

## 2014-05-31 DIAGNOSIS — I10 Essential (primary) hypertension: Secondary | ICD-10-CM | POA: Diagnosis not present

## 2014-05-31 DIAGNOSIS — E782 Mixed hyperlipidemia: Secondary | ICD-10-CM | POA: Diagnosis not present

## 2014-06-19 DIAGNOSIS — I10 Essential (primary) hypertension: Secondary | ICD-10-CM | POA: Diagnosis not present

## 2014-06-19 DIAGNOSIS — R9431 Abnormal electrocardiogram [ECG] [EKG]: Secondary | ICD-10-CM | POA: Diagnosis not present

## 2014-06-19 DIAGNOSIS — E782 Mixed hyperlipidemia: Secondary | ICD-10-CM | POA: Diagnosis not present

## 2014-06-19 DIAGNOSIS — I6529 Occlusion and stenosis of unspecified carotid artery: Secondary | ICD-10-CM | POA: Diagnosis not present

## 2014-06-27 DIAGNOSIS — I119 Hypertensive heart disease without heart failure: Secondary | ICD-10-CM | POA: Diagnosis not present

## 2014-06-27 DIAGNOSIS — E785 Hyperlipidemia, unspecified: Secondary | ICD-10-CM | POA: Diagnosis not present

## 2014-06-27 DIAGNOSIS — R0602 Shortness of breath: Secondary | ICD-10-CM | POA: Diagnosis not present

## 2014-07-20 DIAGNOSIS — I119 Hypertensive heart disease without heart failure: Secondary | ICD-10-CM | POA: Diagnosis not present

## 2014-09-08 DIAGNOSIS — H1851 Endothelial corneal dystrophy: Secondary | ICD-10-CM | POA: Diagnosis not present

## 2014-09-08 DIAGNOSIS — H4011X1 Primary open-angle glaucoma, mild stage: Secondary | ICD-10-CM | POA: Diagnosis not present

## 2014-09-08 DIAGNOSIS — Z961 Presence of intraocular lens: Secondary | ICD-10-CM | POA: Diagnosis not present

## 2014-09-08 DIAGNOSIS — H04123 Dry eye syndrome of bilateral lacrimal glands: Secondary | ICD-10-CM | POA: Diagnosis not present

## 2014-09-08 DIAGNOSIS — H4011X3 Primary open-angle glaucoma, severe stage: Secondary | ICD-10-CM | POA: Diagnosis not present

## 2014-12-13 DIAGNOSIS — I10 Essential (primary) hypertension: Secondary | ICD-10-CM | POA: Diagnosis not present

## 2014-12-13 DIAGNOSIS — E782 Mixed hyperlipidemia: Secondary | ICD-10-CM | POA: Diagnosis not present

## 2014-12-18 ENCOUNTER — Ambulatory Visit (INDEPENDENT_AMBULATORY_CARE_PROVIDER_SITE_OTHER): Payer: Medicare Other | Admitting: Ophthalmology

## 2014-12-18 DIAGNOSIS — H35371 Puckering of macula, right eye: Secondary | ICD-10-CM

## 2014-12-18 DIAGNOSIS — M1A0790 Idiopathic chronic gout, unspecified ankle and foot, without tophus (tophi): Secondary | ICD-10-CM | POA: Diagnosis not present

## 2014-12-18 DIAGNOSIS — H3531 Nonexudative age-related macular degeneration: Secondary | ICD-10-CM

## 2014-12-18 DIAGNOSIS — I10 Essential (primary) hypertension: Secondary | ICD-10-CM | POA: Diagnosis not present

## 2014-12-18 DIAGNOSIS — H43813 Vitreous degeneration, bilateral: Secondary | ICD-10-CM | POA: Diagnosis not present

## 2014-12-18 DIAGNOSIS — E782 Mixed hyperlipidemia: Secondary | ICD-10-CM | POA: Diagnosis not present

## 2014-12-18 DIAGNOSIS — R011 Cardiac murmur, unspecified: Secondary | ICD-10-CM | POA: Diagnosis not present

## 2014-12-18 DIAGNOSIS — H35033 Hypertensive retinopathy, bilateral: Secondary | ICD-10-CM | POA: Diagnosis not present

## 2015-04-26 DIAGNOSIS — L218 Other seborrheic dermatitis: Secondary | ICD-10-CM | POA: Diagnosis not present

## 2015-04-26 DIAGNOSIS — D1801 Hemangioma of skin and subcutaneous tissue: Secondary | ICD-10-CM | POA: Diagnosis not present

## 2015-04-26 DIAGNOSIS — L821 Other seborrheic keratosis: Secondary | ICD-10-CM | POA: Diagnosis not present

## 2015-04-26 DIAGNOSIS — L812 Freckles: Secondary | ICD-10-CM | POA: Diagnosis not present

## 2015-04-26 DIAGNOSIS — L570 Actinic keratosis: Secondary | ICD-10-CM | POA: Diagnosis not present

## 2015-05-17 DIAGNOSIS — H401112 Primary open-angle glaucoma, right eye, moderate stage: Secondary | ICD-10-CM | POA: Diagnosis not present

## 2015-05-17 DIAGNOSIS — Z961 Presence of intraocular lens: Secondary | ICD-10-CM | POA: Diagnosis not present

## 2015-05-17 DIAGNOSIS — H353114 Nonexudative age-related macular degeneration, right eye, advanced atrophic with subfoveal involvement: Secondary | ICD-10-CM | POA: Diagnosis not present

## 2015-05-17 DIAGNOSIS — H1851 Endothelial corneal dystrophy: Secondary | ICD-10-CM | POA: Diagnosis not present

## 2015-05-17 DIAGNOSIS — H401123 Primary open-angle glaucoma, left eye, severe stage: Secondary | ICD-10-CM | POA: Diagnosis not present

## 2015-06-27 DIAGNOSIS — E782 Mixed hyperlipidemia: Secondary | ICD-10-CM | POA: Diagnosis not present

## 2015-06-27 DIAGNOSIS — E119 Type 2 diabetes mellitus without complications: Secondary | ICD-10-CM | POA: Diagnosis not present

## 2015-06-27 DIAGNOSIS — R011 Cardiac murmur, unspecified: Secondary | ICD-10-CM | POA: Diagnosis not present

## 2015-06-27 DIAGNOSIS — M1A0790 Idiopathic chronic gout, unspecified ankle and foot, without tophus (tophi): Secondary | ICD-10-CM | POA: Diagnosis not present

## 2015-06-27 DIAGNOSIS — I10 Essential (primary) hypertension: Secondary | ICD-10-CM | POA: Diagnosis not present

## 2015-07-06 DIAGNOSIS — M810 Age-related osteoporosis without current pathological fracture: Secondary | ICD-10-CM | POA: Diagnosis not present

## 2015-07-06 DIAGNOSIS — E782 Mixed hyperlipidemia: Secondary | ICD-10-CM | POA: Diagnosis not present

## 2015-07-06 DIAGNOSIS — R9431 Abnormal electrocardiogram [ECG] [EKG]: Secondary | ICD-10-CM | POA: Diagnosis not present

## 2015-07-06 DIAGNOSIS — I10 Essential (primary) hypertension: Secondary | ICD-10-CM | POA: Diagnosis not present

## 2015-07-06 DIAGNOSIS — I6529 Occlusion and stenosis of unspecified carotid artery: Secondary | ICD-10-CM | POA: Diagnosis not present

## 2015-08-13 DIAGNOSIS — H35371 Puckering of macula, right eye: Secondary | ICD-10-CM | POA: Diagnosis not present

## 2015-08-13 DIAGNOSIS — H401123 Primary open-angle glaucoma, left eye, severe stage: Secondary | ICD-10-CM | POA: Diagnosis not present

## 2015-08-13 DIAGNOSIS — H401111 Primary open-angle glaucoma, right eye, mild stage: Secondary | ICD-10-CM | POA: Diagnosis not present

## 2015-08-13 DIAGNOSIS — H1851 Endothelial corneal dystrophy: Secondary | ICD-10-CM | POA: Diagnosis not present

## 2015-08-13 DIAGNOSIS — Z961 Presence of intraocular lens: Secondary | ICD-10-CM | POA: Diagnosis not present

## 2015-11-27 ENCOUNTER — Other Ambulatory Visit: Payer: Self-pay

## 2015-11-30 DIAGNOSIS — Z961 Presence of intraocular lens: Secondary | ICD-10-CM | POA: Diagnosis not present

## 2015-11-30 DIAGNOSIS — H1851 Endothelial corneal dystrophy: Secondary | ICD-10-CM | POA: Diagnosis not present

## 2015-11-30 DIAGNOSIS — H401133 Primary open-angle glaucoma, bilateral, severe stage: Secondary | ICD-10-CM | POA: Diagnosis not present

## 2015-11-30 DIAGNOSIS — H35371 Puckering of macula, right eye: Secondary | ICD-10-CM | POA: Diagnosis not present

## 2015-12-18 ENCOUNTER — Ambulatory Visit (INDEPENDENT_AMBULATORY_CARE_PROVIDER_SITE_OTHER): Payer: Medicare Other | Admitting: Ophthalmology

## 2015-12-25 DIAGNOSIS — Z23 Encounter for immunization: Secondary | ICD-10-CM | POA: Diagnosis not present

## 2015-12-31 ENCOUNTER — Ambulatory Visit (INDEPENDENT_AMBULATORY_CARE_PROVIDER_SITE_OTHER): Payer: Medicare Other | Admitting: Ophthalmology

## 2015-12-31 DIAGNOSIS — H43813 Vitreous degeneration, bilateral: Secondary | ICD-10-CM

## 2015-12-31 DIAGNOSIS — H33302 Unspecified retinal break, left eye: Secondary | ICD-10-CM | POA: Diagnosis not present

## 2015-12-31 DIAGNOSIS — I10 Essential (primary) hypertension: Secondary | ICD-10-CM

## 2015-12-31 DIAGNOSIS — H35033 Hypertensive retinopathy, bilateral: Secondary | ICD-10-CM | POA: Diagnosis not present

## 2015-12-31 DIAGNOSIS — H35371 Puckering of macula, right eye: Secondary | ICD-10-CM

## 2015-12-31 DIAGNOSIS — H353131 Nonexudative age-related macular degeneration, bilateral, early dry stage: Secondary | ICD-10-CM | POA: Diagnosis not present

## 2016-01-16 DIAGNOSIS — I10 Essential (primary) hypertension: Secondary | ICD-10-CM | POA: Diagnosis not present

## 2016-01-16 DIAGNOSIS — E782 Mixed hyperlipidemia: Secondary | ICD-10-CM | POA: Diagnosis not present

## 2016-01-18 DIAGNOSIS — R9431 Abnormal electrocardiogram [ECG] [EKG]: Secondary | ICD-10-CM | POA: Diagnosis not present

## 2016-01-18 DIAGNOSIS — E782 Mixed hyperlipidemia: Secondary | ICD-10-CM | POA: Diagnosis not present

## 2016-01-18 DIAGNOSIS — I6529 Occlusion and stenosis of unspecified carotid artery: Secondary | ICD-10-CM | POA: Diagnosis not present

## 2016-01-18 DIAGNOSIS — M1A0790 Idiopathic chronic gout, unspecified ankle and foot, without tophus (tophi): Secondary | ICD-10-CM | POA: Diagnosis not present

## 2016-02-26 DIAGNOSIS — H9192 Unspecified hearing loss, left ear: Secondary | ICD-10-CM | POA: Diagnosis not present

## 2016-02-29 DIAGNOSIS — H903 Sensorineural hearing loss, bilateral: Secondary | ICD-10-CM | POA: Diagnosis not present

## 2016-03-13 DIAGNOSIS — H60509 Unspecified acute noninfective otitis externa, unspecified ear: Secondary | ICD-10-CM | POA: Diagnosis not present

## 2016-05-20 DIAGNOSIS — H1851 Endothelial corneal dystrophy: Secondary | ICD-10-CM | POA: Diagnosis not present

## 2016-05-20 DIAGNOSIS — Z961 Presence of intraocular lens: Secondary | ICD-10-CM | POA: Diagnosis not present

## 2016-05-20 DIAGNOSIS — H401133 Primary open-angle glaucoma, bilateral, severe stage: Secondary | ICD-10-CM | POA: Diagnosis not present

## 2016-05-20 DIAGNOSIS — H35371 Puckering of macula, right eye: Secondary | ICD-10-CM | POA: Diagnosis not present

## 2016-06-04 DIAGNOSIS — I499 Cardiac arrhythmia, unspecified: Secondary | ICD-10-CM | POA: Diagnosis not present

## 2016-06-04 DIAGNOSIS — E785 Hyperlipidemia, unspecified: Secondary | ICD-10-CM | POA: Diagnosis not present

## 2016-06-04 DIAGNOSIS — I10 Essential (primary) hypertension: Secondary | ICD-10-CM | POA: Diagnosis not present

## 2016-06-05 DIAGNOSIS — R634 Abnormal weight loss: Secondary | ICD-10-CM | POA: Diagnosis not present

## 2016-06-05 DIAGNOSIS — I739 Peripheral vascular disease, unspecified: Secondary | ICD-10-CM | POA: Diagnosis not present

## 2016-06-05 DIAGNOSIS — N183 Chronic kidney disease, stage 3 (moderate): Secondary | ICD-10-CM | POA: Diagnosis not present

## 2016-06-05 DIAGNOSIS — I10 Essential (primary) hypertension: Secondary | ICD-10-CM | POA: Diagnosis not present

## 2016-06-05 DIAGNOSIS — E785 Hyperlipidemia, unspecified: Secondary | ICD-10-CM | POA: Diagnosis not present

## 2016-06-10 DIAGNOSIS — M159 Polyosteoarthritis, unspecified: Secondary | ICD-10-CM | POA: Diagnosis not present

## 2016-06-10 DIAGNOSIS — R54 Age-related physical debility: Secondary | ICD-10-CM | POA: Diagnosis not present

## 2016-06-10 DIAGNOSIS — R1312 Dysphagia, oropharyngeal phase: Secondary | ICD-10-CM | POA: Diagnosis not present

## 2016-06-11 DIAGNOSIS — R1312 Dysphagia, oropharyngeal phase: Secondary | ICD-10-CM | POA: Diagnosis not present

## 2016-06-11 DIAGNOSIS — R54 Age-related physical debility: Secondary | ICD-10-CM | POA: Diagnosis not present

## 2016-06-11 DIAGNOSIS — M159 Polyosteoarthritis, unspecified: Secondary | ICD-10-CM | POA: Diagnosis not present

## 2016-06-13 DIAGNOSIS — R1312 Dysphagia, oropharyngeal phase: Secondary | ICD-10-CM | POA: Diagnosis not present

## 2016-06-13 DIAGNOSIS — R54 Age-related physical debility: Secondary | ICD-10-CM | POA: Diagnosis not present

## 2016-06-13 DIAGNOSIS — M159 Polyosteoarthritis, unspecified: Secondary | ICD-10-CM | POA: Diagnosis not present

## 2016-06-17 DIAGNOSIS — R54 Age-related physical debility: Secondary | ICD-10-CM | POA: Diagnosis not present

## 2016-06-17 DIAGNOSIS — M159 Polyosteoarthritis, unspecified: Secondary | ICD-10-CM | POA: Diagnosis not present

## 2016-06-17 DIAGNOSIS — R1312 Dysphagia, oropharyngeal phase: Secondary | ICD-10-CM | POA: Diagnosis not present

## 2016-06-18 DIAGNOSIS — R1312 Dysphagia, oropharyngeal phase: Secondary | ICD-10-CM | POA: Diagnosis not present

## 2016-06-18 DIAGNOSIS — M159 Polyosteoarthritis, unspecified: Secondary | ICD-10-CM | POA: Diagnosis not present

## 2016-06-18 DIAGNOSIS — R54 Age-related physical debility: Secondary | ICD-10-CM | POA: Diagnosis not present

## 2016-06-20 DIAGNOSIS — R54 Age-related physical debility: Secondary | ICD-10-CM | POA: Diagnosis not present

## 2016-06-20 DIAGNOSIS — M159 Polyosteoarthritis, unspecified: Secondary | ICD-10-CM | POA: Diagnosis not present

## 2016-06-20 DIAGNOSIS — R1312 Dysphagia, oropharyngeal phase: Secondary | ICD-10-CM | POA: Diagnosis not present

## 2016-06-24 DIAGNOSIS — R1312 Dysphagia, oropharyngeal phase: Secondary | ICD-10-CM | POA: Diagnosis not present

## 2016-06-24 DIAGNOSIS — R54 Age-related physical debility: Secondary | ICD-10-CM | POA: Diagnosis not present

## 2016-06-24 DIAGNOSIS — M159 Polyosteoarthritis, unspecified: Secondary | ICD-10-CM | POA: Diagnosis not present

## 2016-06-25 DIAGNOSIS — R54 Age-related physical debility: Secondary | ICD-10-CM | POA: Diagnosis not present

## 2016-06-25 DIAGNOSIS — M159 Polyosteoarthritis, unspecified: Secondary | ICD-10-CM | POA: Diagnosis not present

## 2016-06-25 DIAGNOSIS — R1312 Dysphagia, oropharyngeal phase: Secondary | ICD-10-CM | POA: Diagnosis not present

## 2016-06-30 DIAGNOSIS — M159 Polyosteoarthritis, unspecified: Secondary | ICD-10-CM | POA: Diagnosis not present

## 2016-06-30 DIAGNOSIS — R1312 Dysphagia, oropharyngeal phase: Secondary | ICD-10-CM | POA: Diagnosis not present

## 2016-06-30 DIAGNOSIS — R54 Age-related physical debility: Secondary | ICD-10-CM | POA: Diagnosis not present

## 2016-07-02 DIAGNOSIS — R54 Age-related physical debility: Secondary | ICD-10-CM | POA: Diagnosis not present

## 2016-07-02 DIAGNOSIS — R1312 Dysphagia, oropharyngeal phase: Secondary | ICD-10-CM | POA: Diagnosis not present

## 2016-07-02 DIAGNOSIS — M159 Polyosteoarthritis, unspecified: Secondary | ICD-10-CM | POA: Diagnosis not present

## 2016-07-04 DIAGNOSIS — R1312 Dysphagia, oropharyngeal phase: Secondary | ICD-10-CM | POA: Diagnosis not present

## 2016-07-04 DIAGNOSIS — R54 Age-related physical debility: Secondary | ICD-10-CM | POA: Diagnosis not present

## 2016-07-04 DIAGNOSIS — M159 Polyosteoarthritis, unspecified: Secondary | ICD-10-CM | POA: Diagnosis not present

## 2016-07-07 DIAGNOSIS — R54 Age-related physical debility: Secondary | ICD-10-CM | POA: Diagnosis not present

## 2016-07-07 DIAGNOSIS — R1312 Dysphagia, oropharyngeal phase: Secondary | ICD-10-CM | POA: Diagnosis not present

## 2016-07-07 DIAGNOSIS — M159 Polyosteoarthritis, unspecified: Secondary | ICD-10-CM | POA: Diagnosis not present

## 2016-07-09 DIAGNOSIS — M159 Polyosteoarthritis, unspecified: Secondary | ICD-10-CM | POA: Diagnosis not present

## 2016-07-09 DIAGNOSIS — R1312 Dysphagia, oropharyngeal phase: Secondary | ICD-10-CM | POA: Diagnosis not present

## 2016-07-09 DIAGNOSIS — R54 Age-related physical debility: Secondary | ICD-10-CM | POA: Diagnosis not present

## 2016-07-11 DIAGNOSIS — R54 Age-related physical debility: Secondary | ICD-10-CM | POA: Diagnosis not present

## 2016-07-11 DIAGNOSIS — R1312 Dysphagia, oropharyngeal phase: Secondary | ICD-10-CM | POA: Diagnosis not present

## 2016-07-11 DIAGNOSIS — M159 Polyosteoarthritis, unspecified: Secondary | ICD-10-CM | POA: Diagnosis not present

## 2016-07-14 DIAGNOSIS — R54 Age-related physical debility: Secondary | ICD-10-CM | POA: Diagnosis not present

## 2016-07-14 DIAGNOSIS — M159 Polyosteoarthritis, unspecified: Secondary | ICD-10-CM | POA: Diagnosis not present

## 2016-07-14 DIAGNOSIS — R1312 Dysphagia, oropharyngeal phase: Secondary | ICD-10-CM | POA: Diagnosis not present

## 2016-07-16 DIAGNOSIS — M159 Polyosteoarthritis, unspecified: Secondary | ICD-10-CM | POA: Diagnosis not present

## 2016-07-16 DIAGNOSIS — R54 Age-related physical debility: Secondary | ICD-10-CM | POA: Diagnosis not present

## 2016-07-16 DIAGNOSIS — R1312 Dysphagia, oropharyngeal phase: Secondary | ICD-10-CM | POA: Diagnosis not present

## 2016-07-18 DIAGNOSIS — R1312 Dysphagia, oropharyngeal phase: Secondary | ICD-10-CM | POA: Diagnosis not present

## 2016-07-18 DIAGNOSIS — R54 Age-related physical debility: Secondary | ICD-10-CM | POA: Diagnosis not present

## 2016-07-18 DIAGNOSIS — M159 Polyosteoarthritis, unspecified: Secondary | ICD-10-CM | POA: Diagnosis not present

## 2016-08-01 DIAGNOSIS — L821 Other seborrheic keratosis: Secondary | ICD-10-CM | POA: Diagnosis not present

## 2016-08-01 DIAGNOSIS — L0889 Other specified local infections of the skin and subcutaneous tissue: Secondary | ICD-10-CM | POA: Diagnosis not present

## 2016-08-01 DIAGNOSIS — L814 Other melanin hyperpigmentation: Secondary | ICD-10-CM | POA: Diagnosis not present

## 2016-08-27 DIAGNOSIS — L814 Other melanin hyperpigmentation: Secondary | ICD-10-CM | POA: Diagnosis not present

## 2016-08-27 DIAGNOSIS — L0889 Other specified local infections of the skin and subcutaneous tissue: Secondary | ICD-10-CM | POA: Diagnosis not present

## 2016-09-30 DIAGNOSIS — R279 Unspecified lack of coordination: Secondary | ICD-10-CM | POA: Diagnosis not present

## 2016-09-30 DIAGNOSIS — R54 Age-related physical debility: Secondary | ICD-10-CM | POA: Diagnosis not present

## 2016-09-30 DIAGNOSIS — M159 Polyosteoarthritis, unspecified: Secondary | ICD-10-CM | POA: Diagnosis not present

## 2016-09-30 DIAGNOSIS — R4181 Age-related cognitive decline: Secondary | ICD-10-CM | POA: Diagnosis not present

## 2016-09-30 DIAGNOSIS — R41840 Attention and concentration deficit: Secondary | ICD-10-CM | POA: Diagnosis not present

## 2016-09-30 DIAGNOSIS — R41842 Visuospatial deficit: Secondary | ICD-10-CM | POA: Diagnosis not present

## 2016-09-30 DIAGNOSIS — R2681 Unsteadiness on feet: Secondary | ICD-10-CM | POA: Diagnosis not present

## 2016-10-02 DIAGNOSIS — R41842 Visuospatial deficit: Secondary | ICD-10-CM | POA: Diagnosis not present

## 2016-10-02 DIAGNOSIS — R4181 Age-related cognitive decline: Secondary | ICD-10-CM | POA: Diagnosis not present

## 2016-10-02 DIAGNOSIS — M159 Polyosteoarthritis, unspecified: Secondary | ICD-10-CM | POA: Diagnosis not present

## 2016-10-02 DIAGNOSIS — R279 Unspecified lack of coordination: Secondary | ICD-10-CM | POA: Diagnosis not present

## 2016-10-02 DIAGNOSIS — R41840 Attention and concentration deficit: Secondary | ICD-10-CM | POA: Diagnosis not present

## 2016-10-02 DIAGNOSIS — R2681 Unsteadiness on feet: Secondary | ICD-10-CM | POA: Diagnosis not present

## 2016-10-06 DIAGNOSIS — R41840 Attention and concentration deficit: Secondary | ICD-10-CM | POA: Diagnosis not present

## 2016-10-06 DIAGNOSIS — M159 Polyosteoarthritis, unspecified: Secondary | ICD-10-CM | POA: Diagnosis not present

## 2016-10-06 DIAGNOSIS — R2681 Unsteadiness on feet: Secondary | ICD-10-CM | POA: Diagnosis not present

## 2016-10-06 DIAGNOSIS — R41842 Visuospatial deficit: Secondary | ICD-10-CM | POA: Diagnosis not present

## 2016-10-06 DIAGNOSIS — R4181 Age-related cognitive decline: Secondary | ICD-10-CM | POA: Diagnosis not present

## 2016-10-06 DIAGNOSIS — R279 Unspecified lack of coordination: Secondary | ICD-10-CM | POA: Diagnosis not present

## 2016-10-07 DIAGNOSIS — R279 Unspecified lack of coordination: Secondary | ICD-10-CM | POA: Diagnosis not present

## 2016-10-07 DIAGNOSIS — R41842 Visuospatial deficit: Secondary | ICD-10-CM | POA: Diagnosis not present

## 2016-10-07 DIAGNOSIS — M159 Polyosteoarthritis, unspecified: Secondary | ICD-10-CM | POA: Diagnosis not present

## 2016-10-07 DIAGNOSIS — R41840 Attention and concentration deficit: Secondary | ICD-10-CM | POA: Diagnosis not present

## 2016-10-07 DIAGNOSIS — R4181 Age-related cognitive decline: Secondary | ICD-10-CM | POA: Diagnosis not present

## 2016-10-07 DIAGNOSIS — R2681 Unsteadiness on feet: Secondary | ICD-10-CM | POA: Diagnosis not present

## 2016-10-08 DIAGNOSIS — R41842 Visuospatial deficit: Secondary | ICD-10-CM | POA: Diagnosis not present

## 2016-10-08 DIAGNOSIS — R41840 Attention and concentration deficit: Secondary | ICD-10-CM | POA: Diagnosis not present

## 2016-10-08 DIAGNOSIS — M159 Polyosteoarthritis, unspecified: Secondary | ICD-10-CM | POA: Diagnosis not present

## 2016-10-08 DIAGNOSIS — R279 Unspecified lack of coordination: Secondary | ICD-10-CM | POA: Diagnosis not present

## 2016-10-08 DIAGNOSIS — R2681 Unsteadiness on feet: Secondary | ICD-10-CM | POA: Diagnosis not present

## 2016-10-08 DIAGNOSIS — R4181 Age-related cognitive decline: Secondary | ICD-10-CM | POA: Diagnosis not present

## 2016-10-10 DIAGNOSIS — R4181 Age-related cognitive decline: Secondary | ICD-10-CM | POA: Diagnosis not present

## 2016-10-10 DIAGNOSIS — R41842 Visuospatial deficit: Secondary | ICD-10-CM | POA: Diagnosis not present

## 2016-10-10 DIAGNOSIS — R279 Unspecified lack of coordination: Secondary | ICD-10-CM | POA: Diagnosis not present

## 2016-10-10 DIAGNOSIS — M159 Polyosteoarthritis, unspecified: Secondary | ICD-10-CM | POA: Diagnosis not present

## 2016-10-10 DIAGNOSIS — R41840 Attention and concentration deficit: Secondary | ICD-10-CM | POA: Diagnosis not present

## 2016-10-10 DIAGNOSIS — R2681 Unsteadiness on feet: Secondary | ICD-10-CM | POA: Diagnosis not present

## 2016-10-13 DIAGNOSIS — R4181 Age-related cognitive decline: Secondary | ICD-10-CM | POA: Diagnosis not present

## 2016-10-13 DIAGNOSIS — R41842 Visuospatial deficit: Secondary | ICD-10-CM | POA: Diagnosis not present

## 2016-10-13 DIAGNOSIS — R41840 Attention and concentration deficit: Secondary | ICD-10-CM | POA: Diagnosis not present

## 2016-10-13 DIAGNOSIS — R2681 Unsteadiness on feet: Secondary | ICD-10-CM | POA: Diagnosis not present

## 2016-10-13 DIAGNOSIS — R279 Unspecified lack of coordination: Secondary | ICD-10-CM | POA: Diagnosis not present

## 2016-10-13 DIAGNOSIS — M159 Polyosteoarthritis, unspecified: Secondary | ICD-10-CM | POA: Diagnosis not present

## 2016-10-14 DIAGNOSIS — R4181 Age-related cognitive decline: Secondary | ICD-10-CM | POA: Diagnosis not present

## 2016-10-14 DIAGNOSIS — M159 Polyosteoarthritis, unspecified: Secondary | ICD-10-CM | POA: Diagnosis not present

## 2016-10-14 DIAGNOSIS — R279 Unspecified lack of coordination: Secondary | ICD-10-CM | POA: Diagnosis not present

## 2016-10-14 DIAGNOSIS — R41842 Visuospatial deficit: Secondary | ICD-10-CM | POA: Diagnosis not present

## 2016-10-14 DIAGNOSIS — R41840 Attention and concentration deficit: Secondary | ICD-10-CM | POA: Diagnosis not present

## 2016-10-14 DIAGNOSIS — R2681 Unsteadiness on feet: Secondary | ICD-10-CM | POA: Diagnosis not present

## 2016-10-15 DIAGNOSIS — M159 Polyosteoarthritis, unspecified: Secondary | ICD-10-CM | POA: Diagnosis not present

## 2016-10-15 DIAGNOSIS — R2681 Unsteadiness on feet: Secondary | ICD-10-CM | POA: Diagnosis not present

## 2016-10-15 DIAGNOSIS — R41842 Visuospatial deficit: Secondary | ICD-10-CM | POA: Diagnosis not present

## 2016-10-15 DIAGNOSIS — R41840 Attention and concentration deficit: Secondary | ICD-10-CM | POA: Diagnosis not present

## 2016-10-15 DIAGNOSIS — R279 Unspecified lack of coordination: Secondary | ICD-10-CM | POA: Diagnosis not present

## 2016-10-15 DIAGNOSIS — R4181 Age-related cognitive decline: Secondary | ICD-10-CM | POA: Diagnosis not present

## 2016-10-16 DIAGNOSIS — M159 Polyosteoarthritis, unspecified: Secondary | ICD-10-CM | POA: Diagnosis not present

## 2016-10-16 DIAGNOSIS — R41842 Visuospatial deficit: Secondary | ICD-10-CM | POA: Diagnosis not present

## 2016-10-16 DIAGNOSIS — R41840 Attention and concentration deficit: Secondary | ICD-10-CM | POA: Diagnosis not present

## 2016-10-16 DIAGNOSIS — R4181 Age-related cognitive decline: Secondary | ICD-10-CM | POA: Diagnosis not present

## 2016-10-16 DIAGNOSIS — R2681 Unsteadiness on feet: Secondary | ICD-10-CM | POA: Diagnosis not present

## 2016-10-16 DIAGNOSIS — R279 Unspecified lack of coordination: Secondary | ICD-10-CM | POA: Diagnosis not present

## 2016-10-17 DIAGNOSIS — R2681 Unsteadiness on feet: Secondary | ICD-10-CM | POA: Diagnosis not present

## 2016-10-17 DIAGNOSIS — L814 Other melanin hyperpigmentation: Secondary | ICD-10-CM | POA: Diagnosis not present

## 2016-10-17 DIAGNOSIS — R4181 Age-related cognitive decline: Secondary | ICD-10-CM | POA: Diagnosis not present

## 2016-10-17 DIAGNOSIS — C4441 Basal cell carcinoma of skin of scalp and neck: Secondary | ICD-10-CM | POA: Diagnosis not present

## 2016-10-17 DIAGNOSIS — R41840 Attention and concentration deficit: Secondary | ICD-10-CM | POA: Diagnosis not present

## 2016-10-17 DIAGNOSIS — D485 Neoplasm of uncertain behavior of skin: Secondary | ICD-10-CM | POA: Diagnosis not present

## 2016-10-17 DIAGNOSIS — R41842 Visuospatial deficit: Secondary | ICD-10-CM | POA: Diagnosis not present

## 2016-10-17 DIAGNOSIS — R279 Unspecified lack of coordination: Secondary | ICD-10-CM | POA: Diagnosis not present

## 2016-10-17 DIAGNOSIS — M159 Polyosteoarthritis, unspecified: Secondary | ICD-10-CM | POA: Diagnosis not present

## 2016-10-20 DIAGNOSIS — R41842 Visuospatial deficit: Secondary | ICD-10-CM | POA: Diagnosis not present

## 2016-10-20 DIAGNOSIS — R279 Unspecified lack of coordination: Secondary | ICD-10-CM | POA: Diagnosis not present

## 2016-10-20 DIAGNOSIS — R41840 Attention and concentration deficit: Secondary | ICD-10-CM | POA: Diagnosis not present

## 2016-10-20 DIAGNOSIS — R4181 Age-related cognitive decline: Secondary | ICD-10-CM | POA: Diagnosis not present

## 2016-10-20 DIAGNOSIS — R2681 Unsteadiness on feet: Secondary | ICD-10-CM | POA: Diagnosis not present

## 2016-10-20 DIAGNOSIS — M159 Polyosteoarthritis, unspecified: Secondary | ICD-10-CM | POA: Diagnosis not present

## 2016-10-21 DIAGNOSIS — R279 Unspecified lack of coordination: Secondary | ICD-10-CM | POA: Diagnosis not present

## 2016-10-21 DIAGNOSIS — R2681 Unsteadiness on feet: Secondary | ICD-10-CM | POA: Diagnosis not present

## 2016-10-21 DIAGNOSIS — R4181 Age-related cognitive decline: Secondary | ICD-10-CM | POA: Diagnosis not present

## 2016-10-21 DIAGNOSIS — M159 Polyosteoarthritis, unspecified: Secondary | ICD-10-CM | POA: Diagnosis not present

## 2016-10-21 DIAGNOSIS — R41840 Attention and concentration deficit: Secondary | ICD-10-CM | POA: Diagnosis not present

## 2016-10-21 DIAGNOSIS — R41842 Visuospatial deficit: Secondary | ICD-10-CM | POA: Diagnosis not present

## 2016-10-23 DIAGNOSIS — R4181 Age-related cognitive decline: Secondary | ICD-10-CM | POA: Diagnosis not present

## 2016-10-23 DIAGNOSIS — R41840 Attention and concentration deficit: Secondary | ICD-10-CM | POA: Diagnosis not present

## 2016-10-23 DIAGNOSIS — M159 Polyosteoarthritis, unspecified: Secondary | ICD-10-CM | POA: Diagnosis not present

## 2016-10-23 DIAGNOSIS — R41842 Visuospatial deficit: Secondary | ICD-10-CM | POA: Diagnosis not present

## 2016-10-23 DIAGNOSIS — R279 Unspecified lack of coordination: Secondary | ICD-10-CM | POA: Diagnosis not present

## 2016-10-23 DIAGNOSIS — R2681 Unsteadiness on feet: Secondary | ICD-10-CM | POA: Diagnosis not present

## 2016-10-24 DIAGNOSIS — R2681 Unsteadiness on feet: Secondary | ICD-10-CM | POA: Diagnosis not present

## 2016-10-24 DIAGNOSIS — R41842 Visuospatial deficit: Secondary | ICD-10-CM | POA: Diagnosis not present

## 2016-10-24 DIAGNOSIS — R4181 Age-related cognitive decline: Secondary | ICD-10-CM | POA: Diagnosis not present

## 2016-10-24 DIAGNOSIS — M159 Polyosteoarthritis, unspecified: Secondary | ICD-10-CM | POA: Diagnosis not present

## 2016-10-24 DIAGNOSIS — R41840 Attention and concentration deficit: Secondary | ICD-10-CM | POA: Diagnosis not present

## 2016-10-24 DIAGNOSIS — R279 Unspecified lack of coordination: Secondary | ICD-10-CM | POA: Diagnosis not present

## 2016-10-27 DIAGNOSIS — R41842 Visuospatial deficit: Secondary | ICD-10-CM | POA: Diagnosis not present

## 2016-10-27 DIAGNOSIS — M159 Polyosteoarthritis, unspecified: Secondary | ICD-10-CM | POA: Diagnosis not present

## 2016-10-27 DIAGNOSIS — R2681 Unsteadiness on feet: Secondary | ICD-10-CM | POA: Diagnosis not present

## 2016-10-27 DIAGNOSIS — R4181 Age-related cognitive decline: Secondary | ICD-10-CM | POA: Diagnosis not present

## 2016-10-27 DIAGNOSIS — R279 Unspecified lack of coordination: Secondary | ICD-10-CM | POA: Diagnosis not present

## 2016-10-27 DIAGNOSIS — R41840 Attention and concentration deficit: Secondary | ICD-10-CM | POA: Diagnosis not present

## 2016-10-28 DIAGNOSIS — R41840 Attention and concentration deficit: Secondary | ICD-10-CM | POA: Diagnosis not present

## 2016-10-28 DIAGNOSIS — R279 Unspecified lack of coordination: Secondary | ICD-10-CM | POA: Diagnosis not present

## 2016-10-28 DIAGNOSIS — R4181 Age-related cognitive decline: Secondary | ICD-10-CM | POA: Diagnosis not present

## 2016-10-28 DIAGNOSIS — R2681 Unsteadiness on feet: Secondary | ICD-10-CM | POA: Diagnosis not present

## 2016-10-28 DIAGNOSIS — R41842 Visuospatial deficit: Secondary | ICD-10-CM | POA: Diagnosis not present

## 2016-10-28 DIAGNOSIS — M159 Polyosteoarthritis, unspecified: Secondary | ICD-10-CM | POA: Diagnosis not present

## 2016-10-31 DIAGNOSIS — R2681 Unsteadiness on feet: Secondary | ICD-10-CM | POA: Diagnosis not present

## 2016-10-31 DIAGNOSIS — R41840 Attention and concentration deficit: Secondary | ICD-10-CM | POA: Diagnosis not present

## 2016-10-31 DIAGNOSIS — R41842 Visuospatial deficit: Secondary | ICD-10-CM | POA: Diagnosis not present

## 2016-10-31 DIAGNOSIS — R54 Age-related physical debility: Secondary | ICD-10-CM | POA: Diagnosis not present

## 2016-10-31 DIAGNOSIS — R4181 Age-related cognitive decline: Secondary | ICD-10-CM | POA: Diagnosis not present

## 2016-11-04 DIAGNOSIS — R41840 Attention and concentration deficit: Secondary | ICD-10-CM | POA: Diagnosis not present

## 2016-11-04 DIAGNOSIS — R4181 Age-related cognitive decline: Secondary | ICD-10-CM | POA: Diagnosis not present

## 2016-11-04 DIAGNOSIS — R41842 Visuospatial deficit: Secondary | ICD-10-CM | POA: Diagnosis not present

## 2016-11-04 DIAGNOSIS — R2681 Unsteadiness on feet: Secondary | ICD-10-CM | POA: Diagnosis not present

## 2016-11-04 DIAGNOSIS — R54 Age-related physical debility: Secondary | ICD-10-CM | POA: Diagnosis not present

## 2016-12-03 DIAGNOSIS — E785 Hyperlipidemia, unspecified: Secondary | ICD-10-CM | POA: Diagnosis not present

## 2016-12-03 DIAGNOSIS — N400 Enlarged prostate without lower urinary tract symptoms: Secondary | ICD-10-CM | POA: Diagnosis not present

## 2016-12-03 DIAGNOSIS — I499 Cardiac arrhythmia, unspecified: Secondary | ICD-10-CM | POA: Diagnosis not present

## 2016-12-03 DIAGNOSIS — I10 Essential (primary) hypertension: Secondary | ICD-10-CM | POA: Diagnosis not present

## 2016-12-08 DIAGNOSIS — E785 Hyperlipidemia, unspecified: Secondary | ICD-10-CM | POA: Diagnosis not present

## 2016-12-08 DIAGNOSIS — N400 Enlarged prostate without lower urinary tract symptoms: Secondary | ICD-10-CM | POA: Diagnosis not present

## 2016-12-08 DIAGNOSIS — I10 Essential (primary) hypertension: Secondary | ICD-10-CM | POA: Diagnosis not present

## 2016-12-08 DIAGNOSIS — R54 Age-related physical debility: Secondary | ICD-10-CM | POA: Diagnosis not present

## 2016-12-08 DIAGNOSIS — R634 Abnormal weight loss: Secondary | ICD-10-CM | POA: Diagnosis not present

## 2016-12-08 DIAGNOSIS — R42 Dizziness and giddiness: Secondary | ICD-10-CM | POA: Diagnosis not present

## 2016-12-11 DIAGNOSIS — R42 Dizziness and giddiness: Secondary | ICD-10-CM | POA: Diagnosis not present

## 2016-12-11 DIAGNOSIS — R54 Age-related physical debility: Secondary | ICD-10-CM | POA: Diagnosis not present

## 2016-12-11 DIAGNOSIS — R634 Abnormal weight loss: Secondary | ICD-10-CM | POA: Diagnosis not present

## 2016-12-11 DIAGNOSIS — E785 Hyperlipidemia, unspecified: Secondary | ICD-10-CM | POA: Diagnosis not present

## 2016-12-11 DIAGNOSIS — I10 Essential (primary) hypertension: Secondary | ICD-10-CM | POA: Diagnosis not present

## 2016-12-11 DIAGNOSIS — N400 Enlarged prostate without lower urinary tract symptoms: Secondary | ICD-10-CM | POA: Diagnosis not present

## 2017-02-08 DIAGNOSIS — R601 Generalized edema: Secondary | ICD-10-CM | POA: Diagnosis not present

## 2017-02-09 DIAGNOSIS — R4182 Altered mental status, unspecified: Secondary | ICD-10-CM | POA: Diagnosis not present

## 2017-02-09 DIAGNOSIS — I10 Essential (primary) hypertension: Secondary | ICD-10-CM | POA: Diagnosis not present

## 2017-02-09 DIAGNOSIS — R42 Dizziness and giddiness: Secondary | ICD-10-CM | POA: Diagnosis not present

## 2017-02-09 DIAGNOSIS — E785 Hyperlipidemia, unspecified: Secondary | ICD-10-CM | POA: Diagnosis not present

## 2017-02-09 DIAGNOSIS — R601 Generalized edema: Secondary | ICD-10-CM | POA: Diagnosis not present

## 2017-02-16 DIAGNOSIS — R601 Generalized edema: Secondary | ICD-10-CM | POA: Diagnosis not present

## 2017-02-16 DIAGNOSIS — D649 Anemia, unspecified: Secondary | ICD-10-CM | POA: Diagnosis not present

## 2017-03-16 DIAGNOSIS — R4181 Age-related cognitive decline: Secondary | ICD-10-CM | POA: Diagnosis not present

## 2017-03-16 DIAGNOSIS — R634 Abnormal weight loss: Secondary | ICD-10-CM | POA: Diagnosis not present

## 2017-03-16 DIAGNOSIS — R1310 Dysphagia, unspecified: Secondary | ICD-10-CM | POA: Diagnosis not present

## 2017-03-16 DIAGNOSIS — I499 Cardiac arrhythmia, unspecified: Secondary | ICD-10-CM | POA: Diagnosis not present

## 2017-03-16 DIAGNOSIS — F0390 Unspecified dementia without behavioral disturbance: Secondary | ICD-10-CM | POA: Diagnosis not present

## 2017-03-17 DIAGNOSIS — F0390 Unspecified dementia without behavioral disturbance: Secondary | ICD-10-CM | POA: Diagnosis not present

## 2017-03-17 DIAGNOSIS — I499 Cardiac arrhythmia, unspecified: Secondary | ICD-10-CM | POA: Diagnosis not present

## 2017-03-17 DIAGNOSIS — R634 Abnormal weight loss: Secondary | ICD-10-CM | POA: Diagnosis not present

## 2017-03-17 DIAGNOSIS — R4181 Age-related cognitive decline: Secondary | ICD-10-CM | POA: Diagnosis not present

## 2017-03-17 DIAGNOSIS — R1310 Dysphagia, unspecified: Secondary | ICD-10-CM | POA: Diagnosis not present

## 2017-03-18 DIAGNOSIS — I499 Cardiac arrhythmia, unspecified: Secondary | ICD-10-CM | POA: Diagnosis not present

## 2017-03-18 DIAGNOSIS — R4181 Age-related cognitive decline: Secondary | ICD-10-CM | POA: Diagnosis not present

## 2017-03-18 DIAGNOSIS — F0390 Unspecified dementia without behavioral disturbance: Secondary | ICD-10-CM | POA: Diagnosis not present

## 2017-03-18 DIAGNOSIS — R1310 Dysphagia, unspecified: Secondary | ICD-10-CM | POA: Diagnosis not present

## 2017-03-18 DIAGNOSIS — R634 Abnormal weight loss: Secondary | ICD-10-CM | POA: Diagnosis not present

## 2017-03-19 DIAGNOSIS — R634 Abnormal weight loss: Secondary | ICD-10-CM | POA: Diagnosis not present

## 2017-03-19 DIAGNOSIS — R1310 Dysphagia, unspecified: Secondary | ICD-10-CM | POA: Diagnosis not present

## 2017-03-19 DIAGNOSIS — R4181 Age-related cognitive decline: Secondary | ICD-10-CM | POA: Diagnosis not present

## 2017-03-19 DIAGNOSIS — F0390 Unspecified dementia without behavioral disturbance: Secondary | ICD-10-CM | POA: Diagnosis not present

## 2017-03-19 DIAGNOSIS — I499 Cardiac arrhythmia, unspecified: Secondary | ICD-10-CM | POA: Diagnosis not present

## 2017-03-23 DIAGNOSIS — F0390 Unspecified dementia without behavioral disturbance: Secondary | ICD-10-CM | POA: Diagnosis not present

## 2017-03-23 DIAGNOSIS — R634 Abnormal weight loss: Secondary | ICD-10-CM | POA: Diagnosis not present

## 2017-03-23 DIAGNOSIS — R1310 Dysphagia, unspecified: Secondary | ICD-10-CM | POA: Diagnosis not present

## 2017-03-23 DIAGNOSIS — R4181 Age-related cognitive decline: Secondary | ICD-10-CM | POA: Diagnosis not present

## 2017-03-23 DIAGNOSIS — I499 Cardiac arrhythmia, unspecified: Secondary | ICD-10-CM | POA: Diagnosis not present

## 2017-03-26 DIAGNOSIS — R634 Abnormal weight loss: Secondary | ICD-10-CM | POA: Diagnosis not present

## 2017-03-26 DIAGNOSIS — I499 Cardiac arrhythmia, unspecified: Secondary | ICD-10-CM | POA: Diagnosis not present

## 2017-03-26 DIAGNOSIS — F0390 Unspecified dementia without behavioral disturbance: Secondary | ICD-10-CM | POA: Diagnosis not present

## 2017-03-26 DIAGNOSIS — R4181 Age-related cognitive decline: Secondary | ICD-10-CM | POA: Diagnosis not present

## 2017-03-26 DIAGNOSIS — R1310 Dysphagia, unspecified: Secondary | ICD-10-CM | POA: Diagnosis not present

## 2017-03-27 DIAGNOSIS — R1310 Dysphagia, unspecified: Secondary | ICD-10-CM | POA: Diagnosis not present

## 2017-03-27 DIAGNOSIS — R634 Abnormal weight loss: Secondary | ICD-10-CM | POA: Diagnosis not present

## 2017-03-27 DIAGNOSIS — F0390 Unspecified dementia without behavioral disturbance: Secondary | ICD-10-CM | POA: Diagnosis not present

## 2017-03-27 DIAGNOSIS — I499 Cardiac arrhythmia, unspecified: Secondary | ICD-10-CM | POA: Diagnosis not present

## 2017-03-27 DIAGNOSIS — R4181 Age-related cognitive decline: Secondary | ICD-10-CM | POA: Diagnosis not present

## 2017-03-28 DIAGNOSIS — F0390 Unspecified dementia without behavioral disturbance: Secondary | ICD-10-CM | POA: Diagnosis not present

## 2017-03-28 DIAGNOSIS — R634 Abnormal weight loss: Secondary | ICD-10-CM | POA: Diagnosis not present

## 2017-03-28 DIAGNOSIS — I499 Cardiac arrhythmia, unspecified: Secondary | ICD-10-CM | POA: Diagnosis not present

## 2017-03-28 DIAGNOSIS — R1310 Dysphagia, unspecified: Secondary | ICD-10-CM | POA: Diagnosis not present

## 2017-03-28 DIAGNOSIS — R4181 Age-related cognitive decline: Secondary | ICD-10-CM | POA: Diagnosis not present

## 2017-03-30 DIAGNOSIS — I499 Cardiac arrhythmia, unspecified: Secondary | ICD-10-CM | POA: Diagnosis not present

## 2017-03-30 DIAGNOSIS — F0390 Unspecified dementia without behavioral disturbance: Secondary | ICD-10-CM | POA: Diagnosis not present

## 2017-03-30 DIAGNOSIS — R4181 Age-related cognitive decline: Secondary | ICD-10-CM | POA: Diagnosis not present

## 2017-03-30 DIAGNOSIS — R1310 Dysphagia, unspecified: Secondary | ICD-10-CM | POA: Diagnosis not present

## 2017-03-30 DIAGNOSIS — R634 Abnormal weight loss: Secondary | ICD-10-CM | POA: Diagnosis not present

## 2017-03-31 DEATH — deceased
